# Patient Record
Sex: Female | Born: 1980 | Race: White | Hispanic: No | Marital: Married | Smoking: Never smoker
Health system: Southern US, Community
[De-identification: ages and names within clinical notes are randomized; demographics above are authoritative.]

## PROBLEM LIST (undated history)

## (undated) DIAGNOSIS — D649 Anemia, unspecified: Secondary | ICD-10-CM

## (undated) DIAGNOSIS — N83209 Unspecified ovarian cyst, unspecified side: Secondary | ICD-10-CM

## (undated) DIAGNOSIS — O139 Gestational [pregnancy-induced] hypertension without significant proteinuria, unspecified trimester: Secondary | ICD-10-CM

## (undated) HISTORY — DX: Unspecified ovarian cyst, unspecified side: N83.209

## (undated) HISTORY — DX: Gestational (pregnancy-induced) hypertension without significant proteinuria, unspecified trimester: O13.9

## (undated) HISTORY — PX: CERVICAL BIOPSY  W/ LOOP ELECTRODE EXCISION: SUR135

## (undated) HISTORY — DX: Hemochromatosis, unspecified: E83.119

## (undated) HISTORY — DX: Anemia, unspecified: D64.9

## (undated) HISTORY — PX: OTHER SURGICAL HISTORY: SHX169

---
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
## 2000-12-24 ENCOUNTER — Other Ambulatory Visit: Admission: RE | Admit: 2000-12-24 | Discharge: 2000-12-24 | Payer: Self-pay | Admitting: Obstetrics and Gynecology

## 2005-04-15 ENCOUNTER — Encounter (HOSPITAL_COMMUNITY): Admission: RE | Admit: 2005-04-15 | Discharge: 2005-05-02 | Payer: Self-pay | Admitting: Oncology

## 2005-04-15 ENCOUNTER — Ambulatory Visit (HOSPITAL_COMMUNITY): Payer: Self-pay | Admitting: Oncology

## 2005-04-15 ENCOUNTER — Encounter: Admission: RE | Admit: 2005-04-15 | Discharge: 2005-05-02 | Payer: Self-pay | Admitting: Oncology

## 2005-07-18 ENCOUNTER — Encounter: Admission: RE | Admit: 2005-07-18 | Discharge: 2005-07-18 | Payer: Self-pay | Admitting: Oncology

## 2005-07-18 ENCOUNTER — Ambulatory Visit (HOSPITAL_COMMUNITY): Payer: Self-pay | Admitting: Oncology

## 2005-11-06 ENCOUNTER — Ambulatory Visit (HOSPITAL_COMMUNITY): Payer: Self-pay | Admitting: Oncology

## 2005-11-06 ENCOUNTER — Encounter (HOSPITAL_COMMUNITY): Admission: RE | Admit: 2005-11-06 | Discharge: 2005-12-06 | Payer: Self-pay | Admitting: Oncology

## 2006-03-18 ENCOUNTER — Ambulatory Visit (HOSPITAL_COMMUNITY): Payer: Self-pay | Admitting: Oncology

## 2006-03-18 ENCOUNTER — Encounter (HOSPITAL_COMMUNITY): Admission: RE | Admit: 2006-03-18 | Discharge: 2006-04-17 | Payer: Self-pay | Admitting: Oncology

## 2006-10-16 ENCOUNTER — Ambulatory Visit (HOSPITAL_COMMUNITY): Payer: Self-pay | Admitting: Oncology

## 2007-04-02 ENCOUNTER — Inpatient Hospital Stay (HOSPITAL_COMMUNITY): Admission: AD | Admit: 2007-04-02 | Discharge: 2007-04-02 | Payer: Self-pay | Admitting: *Deleted

## 2007-04-03 ENCOUNTER — Inpatient Hospital Stay (HOSPITAL_COMMUNITY): Admission: AD | Admit: 2007-04-03 | Discharge: 2007-04-03 | Payer: Self-pay | Admitting: *Deleted

## 2007-04-06 ENCOUNTER — Encounter: Payer: Self-pay | Admitting: *Deleted

## 2007-04-06 ENCOUNTER — Encounter (INDEPENDENT_AMBULATORY_CARE_PROVIDER_SITE_OTHER): Payer: Self-pay | Admitting: Obstetrics and Gynecology

## 2007-04-06 ENCOUNTER — Inpatient Hospital Stay (HOSPITAL_COMMUNITY): Admission: AD | Admit: 2007-04-06 | Discharge: 2007-04-10 | Payer: Self-pay | Admitting: Obstetrics & Gynecology

## 2007-04-12 ENCOUNTER — Encounter: Admission: RE | Admit: 2007-04-12 | Discharge: 2007-05-10 | Payer: Self-pay | Admitting: *Deleted

## 2007-07-23 ENCOUNTER — Encounter (HOSPITAL_COMMUNITY): Admission: RE | Admit: 2007-07-23 | Discharge: 2007-08-04 | Payer: Self-pay | Admitting: Oncology

## 2007-07-23 ENCOUNTER — Ambulatory Visit (HOSPITAL_COMMUNITY): Payer: Self-pay | Admitting: Oncology

## 2008-09-23 ENCOUNTER — Encounter: Admission: RE | Admit: 2008-09-23 | Discharge: 2008-09-23 | Payer: Self-pay | Admitting: Family Medicine

## 2009-11-07 ENCOUNTER — Ambulatory Visit (HOSPITAL_COMMUNITY): Payer: Self-pay | Admitting: Oncology

## 2009-11-07 ENCOUNTER — Encounter (HOSPITAL_COMMUNITY): Admission: RE | Admit: 2009-11-07 | Discharge: 2009-12-07 | Payer: Self-pay | Admitting: Oncology

## 2010-08-26 ENCOUNTER — Encounter: Payer: Self-pay | Admitting: *Deleted

## 2010-10-23 LAB — CBC
MCHC: 34.9 g/dL (ref 30.0–36.0)
MCV: 90.8 fL (ref 78.0–100.0)
RDW: 12.8 % (ref 11.5–15.5)

## 2010-10-23 LAB — FERRITIN: Ferritin: 30 ng/mL (ref 10–291)

## 2010-11-04 ENCOUNTER — Encounter (HOSPITAL_COMMUNITY): Payer: 59

## 2010-11-06 ENCOUNTER — Ambulatory Visit (HOSPITAL_COMMUNITY): Payer: Self-pay | Admitting: Oncology

## 2010-11-06 ENCOUNTER — Other Ambulatory Visit (HOSPITAL_COMMUNITY): Payer: Self-pay

## 2010-12-17 NOTE — Op Note (Signed)
NAMEKAMERIN, AXFORD              ACCOUNT NO.:  [id number]   MEDICAL RECORD NO.:  [id number]          PATIENT TYPE:  OUT   LOCATION:  MFM                           FACILITY:  [facility]   PHYSICIAN:  Lenoard Aden, M.D.DATE OF BIRTH:  02/24/1981   DATE OF PROCEDURE:  04/06/2007  DATE OF DISCHARGE:                               OPERATIVE REPORT   PREOPERATIVE DIAGNOSIS:  Severe pre-eclampsia at 21 and 5/7 weeks.   POSTOPERATIVE DIAGNOSIS:  Severe pre-eclampsia at 33 and 5/7 weeks.   PROCEDURE:  Primary low segment transverse cesarean section.   SURGEON:  Lenoard Aden, M.D.   ANESTHESIA:  Spinal by Dr. Harvest Forest.   ESTIMATED BLOOD LOSS:  500 mL.   COMPLICATIONS:  None.   DRAINS:  Foley.   COUNTS:  Correct.   DISPOSITION:  The patient to recovery in good condition on magnesium  sulfate.   FINDINGS:  Full term living female, Apgars 8/8, pediatricians in  attendance, baby to NICU.   BRIEF OPERATIVE NOTE:  After being apprised of the risks of anesthesia,  infection, bleeding, intra-abdominal injury with need of repair, delayed  versus immediate to include bowel and bladder injury, the patient was  brought to the operating room where she was administered a spinal  anesthetic without complications.  She was prepped and draped in the  usual sterile fashion.  A Foley catheter was placed. After achieving  adequate anesthesia with dilute Marcaine solution, a Pfannenstiel skin  incision was made with the scalpel and carried down to the fascia which  is nicked in the midline and extended transversely using Mayo scissors.  The rectus muscles were dissected sharply in the midline, the peritoneum  entered sharply, bladder blade placed.  Visceral peritoneum scored  sharply off the lower uterine segment.  Kerr hysterotomy incision made.  Atraumatic delivery of a full term living female handed to the  pediatrician in attendance.  Apgars of 8/8, cord blood collected.  Placenta delivered  manually intact three vessel cord noted.  Uterus is  curetted using a dry lap pack and closed in two running imbricating  layers of 0 Monocryl suture.  Good hemostasis was noted. After achieving  adequate hemostasis, irrigation was accomplished.  An O'Leary stitch is  placed at the right lateral margin of the uterine incision. At this  time, good hemostasis is noted.  The bladder flap  was inspected and found to be hemostatic.  All blood clots are  subsequently removed.  The fascia then closed using 0 Monocryl in a  running fashion, the skin was closed using staples.  The patient  tolerated the procedure well and was transferred to the recovery room in  good condition.      Lenoard Aden, M.D.  Electronically Signed     RJT/MEDQ  D:  04/06/2007  T:  04/06/2007  Job:  [id number]

## 2010-12-17 NOTE — Discharge Summary (Signed)
NAMEPRINCESA, WILLIG              ACCOUNT NO.:  [id number]   MEDICAL RECORD NO.:  [id number]          PATIENT TYPE:  INP   LOCATION:                                 FACILITY:   PHYSICIAN:  Lenoard Aden, M.D.DATE OF BIRTH:  [birth year]   DATE OF ADMISSION:  04/06/2007  DATE OF DISCHARGE:  04/12/2007                               DISCHARGE SUMMARY   The patient underwent uncomplicated primary C-section for severe  preeclampsia at 33-5/7 weeks.  Postoperative course complicated by  persistent liver function abnormalities and decreased urine output,  placed on magnesium sulfate postpartum eventually tolerated a regular  diet well.  Blood pressure control stable.  Urine output then began to  improve postoperatively.  She was discharged to home as noted.   DISCHARGE MEDICATIONS:  1. Prenatal vitamins.  2. Iron.  3. Tylox.   FOLLOW-UP:  In the office scheduled within one week.  Discharge teaching  done.  Hypertensive and preeclamptic precautions given.      Lenoard Aden, M.D.  Electronically Signed     RJT/MEDQ  D:  05/04/2007  T:  05/04/2007  Job:  [id number]

## 2011-01-28 ENCOUNTER — Inpatient Hospital Stay (HOSPITAL_COMMUNITY)
Admission: RE | Admit: 2011-01-28 | Discharge: 2011-01-30 | DRG: 766 | Disposition: A | Discharge status: Home / Self Care | Payer: 59 | Source: Ambulatory Visit | Attending: Obstetrics and Gynecology | Admitting: Obstetrics and Gynecology

## 2011-01-28 DIAGNOSIS — O34219 Maternal care for unspecified type scar from previous cesarean delivery: Principal | ICD-10-CM | POA: Diagnosis present

## 2011-01-28 LAB — SURGICAL PCR SCREEN: Staphylococcus aureus: NEGATIVE

## 2011-01-28 LAB — CBC
MCHC: 34.9 g/dL (ref 30.0–36.0)
Platelets: 191 10*3/uL (ref 150–400)
RDW: 12.7 % (ref 11.5–15.5)
WBC: 8.6 10*3/uL (ref 4.0–10.5)

## 2011-01-28 LAB — RPR: RPR Ser Ql: NONREACTIVE

## 2011-01-29 LAB — CBC
HCT: 31.2 % — ABNORMAL LOW (ref 36.0–46.0)
Hemoglobin: 10.8 g/dL — ABNORMAL LOW (ref 12.0–15.0)
MCH: 32.6 pg (ref 26.0–34.0)
MCHC: 34.6 g/dL (ref 30.0–36.0)
RDW: 12.8 % (ref 11.5–15.5)

## 2011-02-02 ENCOUNTER — Inpatient Hospital Stay (HOSPITAL_COMMUNITY): Admission: AD | Admit: 2011-02-02 | Payer: Self-pay | Source: Ambulatory Visit | Admitting: Obstetrics and Gynecology

## 2011-02-07 NOTE — H&P (Signed)
  NAMEJADALYN, Elizabeth Carr              ACCOUNT NO.:  [id number]  MEDICAL RECORD NO.:  [id number]  LOCATION:  103                            FACILITY:  PHYSICIAN:  Lenoard Aden, M.D.     DATE OF BIRTH:  DATE OF ADMISSION: DATE OF DISCHARGE:                             HISTORY & PHYSICAL   CHIEF COMPLAINT:  Elective repeat C-section.  HISTORY OF PRESENT ILLNESS:  She is a 30 year old white female G2, P1, currently at 37 weeks' gestation, who presents for repeat low-segment transverse cesarean section.  Previous C-section performed for severe preeclampsia at 34 weeks.  Medications include prenatal vitamins.  She has no known drug allergies.  FAMILY HISTORY:  Heart disease, chronic hypertension, pancreatic cancer, and thyroid disease.  She is a nonsmoker, nondrinker.  She denies domestic or physical violence.  Prenatal course to date has been uncomplicated.  PHYSICAL EXAMINATION:  GENERAL:  She is a well-developed, well-nourished white female in no acute distress. HEENT:  Normal. NECK:  Supple.  Full range of motion. LUNGS:  Clear. HEART:  Regular rhythm. ABDOMEN:  Soft, gravid, nontender.  Estimated fetal weight 7.5 pounds. Cervix is closed, 50%, vertex -1. EXTREMITIES:  No cords. NEUROLOGIC:  Nonfocal. SKIN:  Intact.  IMPRESSION:  Term intrauterine pregnancy at 39 weeks with previous cesarean section for elective repeat.  PLAN:  Proceed with a repeat low-segment transverse cesarean section. Risks of anesthesia, infection, bleeding, injury to abdominal organs, and need for repair were discussed.  Delayed versus immediate complications to include bowel and bladder injury noted.  The patient acknowledges and wishes to proceed.     Lenoard Aden, M.D.     RJT/MEDQ  D:  01/28/2011  T:  01/28/2011  Job:  [id number]  Electronically Signed by Olivia Mackie M.D. on 02/07/2011 07:36:37 AM

## 2011-02-07 NOTE — Op Note (Signed)
  [name], Elizabeth Carr              ACCOUNT NO.:  [id number]  MEDICAL RECORD NO.:  [id number]  LOCATION:  9103                          FACILITY:  [facility]  PHYSICIAN:  Lenoard Aden, M.D.DATE OF BIRTH:  Dec 14, 1980  DATE OF PROCEDURE: DATE OF DISCHARGE:                              OPERATIVE REPORT   PREOPERATIVE DIAGNOSES:  A 39-week intrauterine pregnancy with previous cesarean section for elective repeat.  POSTOPERATIVE DIAGNOSES:  A 39-week intrauterine pregnancy with previous cesarean section for elective repeat plus lower uterine segment window.  PROCEDURE:  Repeat low segment transverse cesarean section.  SURGEON:  Lenoard Aden, MD  ASSISTANT:  Marlinda Mike, CNM  ANESTHESIA:  Spinal by Quillian Quince, MD  ESTIMATED BLOOD LOSS:  800 mL.  COMPLICATIONS:  None.  DRAINS:  Foley.  COUNTS:  Correct.  DISPOSITION:  The patient to recovery in good condition.  BRIEF OPERATIVE NOTE:  After being apprised of the risks of anesthesia, infection, bleeding, injury to abdominal organs, need for repair, delayed versus immediate complications to include bowel and bladder injury, possible need for repair, the patient was brought to the operating room where she was administered spinal anesthetic without complications, prepped and draped in usual sterile fashion.  Foley catheter was placed after achieving adequate anesthesia, dilute Marcaine solution placed.  A Pfannenstiel skin incision was made with a scalpel, carried down to fascia which was nicked in the midline and opened transversely using Mayo scissors.  Rectus muscles were dissected sharply in the midline and peritoneum entered sharply.  Bladder blade was placed.  Visceral peritoneum scored sharply off thinned lower uterine segment with about a 2-3 cm thin transparent window noted.  At this time, Sharl Ma hysterotomy incision was made.  Amniotomy clear fluid. Atraumatic delivery.  Full term living female.  Occiput  anterior position.  Apgars 8 and 9.  Cord blood collected.  Placenta delivered manually intact.  Uterus curetted using a dry lap pack.  Normal tubes and normal ovaries noted.  Uterus closed in two running imbricating layers of 0 Monocryl suture with a single interrupted suture placed in the midline for hemostasis.  Bladder flap inspected, found to be hemostatic.  Irrigation accomplished.  Urine output was copious and urine is clear.  At this time, the fascia was reapproximated using 0 Monocryl in a continuous running fashion.  Skin closed using staples.  The patient tolerated the procedure well, transferred to recovery in good condition.     Lenoard Aden, M.D.     RJT/MEDQ  D:  01/28/2011  T:  01/29/2011  Job:  [id number]  Electronically Signed by Olivia Mackie M.D. on 02/07/2011 07:36:41 AM

## 2011-03-14 NOTE — Discharge Summary (Signed)
  Elizabeth Carr, Elizabeth Carr              ACCOUNT NO.:  [id number]  MEDICAL RECORD NO.:  [id number]  LOCATION:  9103                          FACILITY:  [facility]  PHYSICIAN:  Lenoard Aden, M.D.DATE OF BIRTH:  11/01/1980  DATE OF ADMISSION:  01/28/2011 DATE OF DISCHARGE:  01/30/2011                              DISCHARGE SUMMARY   ADMITTING DIAGNOSIS:  Previous cesarean section at 39 weeks, desires repeat.  DISCHARGE DIAGNOSIS:  Postoperative day #2 status post cesarean section repeat.  HISTORY:  The patient is a 30 year old gravida 2, para 1 at 36 weeks' gestation with an EDC of February 02, 2011.  Prenatal care at [facility] since 7 weeks' gestation with Dr. Billy Coast as primary.  Prenatal labs include type and Rh O+, antibody screen negative, rubella positive, HIV negative, hepatitis B negative, RPR negative, GBS screen negative.  Prenatal course was complicated with a history of severe preeclampsia and HELLP syndrome at 21 weeks' gestation with repeat cesarean section planned this pregnancy, history of transfusion in 2008, status post cesarean section.  ALLERGIES:  No known drug allergies.  MEDICATIONS:  Prenatal vitamin 1 tablet p.o. daily.  ADMISSION REASON:  Scheduled cesarean section.  Admission CBC; white blood cell count 8.6, hemoglobin 11.7, hematocrit 33.5, and a platelet count of 191.  PROCEDURE:  The patient underwent cesarean section on January 28, 2011, by Dr. Billy Coast, delivery of a female 7 pounds 6 ounces, newborn to regular nursery.  POSTOPERATIVE COURSE:  The patient's postoperative course was uneventful.  The patient had no evidence of preeclampsia or HELLP syndrome this pregnancy.  Postoperative CBC; white blood cell count 10.2, hemoglobin 10.8, hematocrit 31.2, and a platelet count of 168. Vital signs were stable.  The patient remained afebrile during hospitalization.  Physical exam was within normal limits.  Wound edges were well approximated with staples.  No erythema,  no ecchymosis or drainage.  The patient is discharged home in stable condition on postoperative day 2.  DISCHARGE INSTRUCTIONS:  Postpartum instructions per [facility] booklet.  ACTIVITY:  Restrictions x2 weeks per postoperative delivery.  DIET:  Regular.  MEDICATIONS:  Prenatal vitamin 1 tablet p.o. daily, ibuprofen 800 mg every 8 hours as needed for discomfort, and Percocet 1 tablet every 4 hours as needed for pain.  FOLLOWUP INSTRUCTIONS:  The patient to follow up at [facility] on postoperative day 5 for staple removal and in 6 weeks for routine postpartum care.     Marlinda Mike, C.N.M.   ______________________________ Lenoard Aden, M.D.    TB/MEDQ  D:  02/03/2011  T:  02/04/2011  Job:  [id number]  Electronically Signed by Marlinda Mike C.N.M. on 02/19/2011 07:02:20 PM Electronically Signed by Olivia Mackie M.D. on 03/14/2011 01:13:17 PM

## 2011-05-09 LAB — FERRITIN: Ferritin: 46 (ref 10–291)

## 2011-05-09 LAB — CBC
MCHC: 34
MCV: 89.4
Platelets: 196
RBC: 4.55
WBC: 3.7 — ABNORMAL LOW

## 2011-05-09 LAB — DIFFERENTIAL
Basophils Relative: 1
Eosinophils Absolute: 0.1 — ABNORMAL LOW
Monocytes Relative: 10
Neutro Abs: 1.1 — ABNORMAL LOW
Neutrophils Relative %: 29 — ABNORMAL LOW

## 2011-05-16 LAB — CBC
HCT: 21.4 — ABNORMAL LOW
HCT: 22.5 — ABNORMAL LOW
HCT: 25.5 — ABNORMAL LOW
HCT: 30.1 — ABNORMAL LOW
HCT: 34.7 — ABNORMAL LOW
Hemoglobin: 12.1
Hemoglobin: 7.3 — CL
Hemoglobin: 7.8 — CL
Hemoglobin: 8.1 — ABNORMAL LOW
Hemoglobin: 8.9 — ABNORMAL LOW
Hemoglobin: 9.3 — ABNORMAL LOW
MCHC: 34.3
MCHC: 34.8
MCHC: 35.3
MCV: 100.2 — ABNORMAL HIGH
MCV: 94.8
MCV: 98.4
MCV: 98.5
MCV: 98.8
Platelets: 172
Platelets: 178
Platelets: 192
RBC: 2.13 — ABNORMAL LOW
RBC: 2.32 — ABNORMAL LOW
RBC: 2.37 — ABNORMAL LOW
RBC: 2.79 — ABNORMAL LOW
RBC: 3.88
RDW: 14.1 — ABNORMAL HIGH
RDW: 14.2 — ABNORMAL HIGH
RDW: 14.3 — ABNORMAL HIGH
RDW: 14.5 — ABNORMAL HIGH
WBC: 14.5 — ABNORMAL HIGH
WBC: 16.3 — ABNORMAL HIGH
WBC: 20 — ABNORMAL HIGH
WBC: 20.9 — ABNORMAL HIGH

## 2011-05-16 LAB — COMPREHENSIVE METABOLIC PANEL
ALT: 312 — ABNORMAL HIGH
ALT: 337 — ABNORMAL HIGH
ALT: 453 — ABNORMAL HIGH
ALT: 460 — ABNORMAL HIGH
AST: 211 — ABNORMAL HIGH
AST: 355 — ABNORMAL HIGH
AST: 372 — ABNORMAL HIGH
AST: 77 — ABNORMAL HIGH
Albumin: 2.2 — ABNORMAL LOW
Albumin: 2.8 — ABNORMAL LOW
Alkaline Phosphatase: 112
Alkaline Phosphatase: 127 — ABNORMAL HIGH
Alkaline Phosphatase: 81
Alkaline Phosphatase: 94
BUN: 10
BUN: 11
BUN: 13
BUN: 19
BUN: 7
CO2: 26
CO2: 27
CO2: 29
CO2: 30
Calcium: 6.6 — ABNORMAL LOW
Calcium: 6.8 — ABNORMAL LOW
Calcium: 7 — ABNORMAL LOW
Chloride: 104
Chloride: 107
Chloride: 107
Creatinine, Ser: 0.71
Creatinine, Ser: 0.76
Creatinine, Ser: 0.8
Creatinine, Ser: 0.92
Creatinine, Ser: 0.96
GFR calc Af Amer: >60
GFR calc Af Amer: >60
GFR calc non Af Amer: >60
GFR calc non Af Amer: >60
GFR calc non Af Amer: >60
GFR calc non Af Amer: >60
Glucose, Bld: 102 — ABNORMAL HIGH
Glucose, Bld: 104 — ABNORMAL HIGH
Glucose, Bld: 111 — ABNORMAL HIGH
Glucose, Bld: 84
Glucose, Bld: 94
Potassium: 3.6
Potassium: 3.7
Potassium: 4
Potassium: 4.4
Sodium: 131 — ABNORMAL LOW
Sodium: 135
Sodium: 136
Sodium: 138
Total Bilirubin: 0.4
Total Bilirubin: 0.4
Total Bilirubin: 0.6
Total Bilirubin: 0.6
Total Protein: 4.1 — ABNORMAL LOW
Total Protein: 4.2 — ABNORMAL LOW
Total Protein: 4.3 — ABNORMAL LOW
Total Protein: 5.1 — ABNORMAL LOW

## 2011-05-16 LAB — MAGNESIUM
Magnesium: 5 — ABNORMAL HIGH
Magnesium: 5.1 — ABNORMAL HIGH
Magnesium: 5.3 — ABNORMAL HIGH
Magnesium: 5.6 — ABNORMAL HIGH

## 2011-05-16 LAB — URIC ACID
Uric Acid, Serum: 6.3
Uric Acid, Serum: 6.4
Uric Acid, Serum: 6.6
Uric Acid, Serum: 7.6 — ABNORMAL HIGH

## 2011-05-16 LAB — CROSSMATCH: Antibody Screen: NEGATIVE

## 2011-05-16 LAB — DIFFERENTIAL
Basophils Relative: 0
Eosinophils Absolute: 0
Eosinophils Absolute: 0
Eosinophils Relative: 0
Lymphs Abs: 4.4 — ABNORMAL HIGH
Lymphs Abs: 5.6 — ABNORMAL HIGH
Monocytes Absolute: 1.1 — ABNORMAL HIGH
Monocytes Relative: 5
Monocytes Relative: 5
Neutro Abs: 17.2 — ABNORMAL HIGH
Neutrophils Relative %: 72
Neutrophils Relative %: 75

## 2011-05-16 LAB — LACTATE DEHYDROGENASE
LDH: 253 — ABNORMAL HIGH
LDH: 284 — ABNORMAL HIGH
LDH: 307 — ABNORMAL HIGH
LDH: 501 — ABNORMAL HIGH

## 2011-05-16 LAB — URINALYSIS, DIPSTICK ONLY
Ketones, ur: NEGATIVE
Leukocytes, UA: NEGATIVE
Nitrite: NEGATIVE
Specific Gravity, Urine: >1.03 — ABNORMAL HIGH
Urobilinogen, UA: 0.2
pH: 5.5

## 2011-05-16 LAB — URINALYSIS, ROUTINE W REFLEX MICROSCOPIC
Bilirubin Urine: NEGATIVE
Glucose, UA: NEGATIVE
Ketones, ur: NEGATIVE
Leukocytes, UA: NEGATIVE
pH: 6

## 2011-05-16 LAB — FETAL FIBRONECTIN: Fetal Fibronectin: NEGATIVE

## 2011-05-16 LAB — URINE MICROSCOPIC-ADD ON

## 2011-08-11 ENCOUNTER — Ambulatory Visit
Admission: RE | Admit: 2011-08-11 | Discharge: 2011-08-11 | Disposition: A | Discharge status: Home / Self Care | Payer: 59 | Source: Ambulatory Visit | Attending: Pediatrics | Admitting: Pediatrics

## 2011-08-11 ENCOUNTER — Other Ambulatory Visit: Payer: Self-pay | Admitting: Pediatrics

## 2011-08-11 DIAGNOSIS — R05 Cough: Secondary | ICD-10-CM

## 2011-08-11 DIAGNOSIS — R0789 Other chest pain: Secondary | ICD-10-CM

## 2011-12-26 ENCOUNTER — Ambulatory Visit (HOSPITAL_COMMUNITY): Payer: 59 | Admitting: Oncology

## 2011-12-30 ENCOUNTER — Encounter (HOSPITAL_COMMUNITY): Payer: Self-pay | Admitting: Oncology

## 2011-12-30 ENCOUNTER — Encounter (HOSPITAL_COMMUNITY): Payer: 59 | Attending: Oncology | Admitting: Oncology

## 2011-12-30 DIAGNOSIS — D649 Anemia, unspecified: Secondary | ICD-10-CM

## 2011-12-30 DIAGNOSIS — D509 Iron deficiency anemia, unspecified: Secondary | ICD-10-CM

## 2011-12-30 LAB — CBC
HCT: 38.3 % (ref 36.0–46.0)
MCHC: 34.5 g/dL (ref 30.0–36.0)
RDW: 13 % (ref 11.5–15.5)

## 2011-12-30 NOTE — Patient Instructions (Signed)
Elizabeth Carr  [id number] 1980/11/17 Dr. Glenford Peers   [facility]  Discharge Instructions  RECOMMENDATIONS MADE BY THE CONSULTANT AND ANY TEST RESULTS WILL BE SENT TO YOUR REFERRING DOCTOR.   EXAM FINDINGS BY MD TODAY AND SIGNS AND SYMPTOMS TO REPORT TO CLINIC OR PRIMARY MD: Exam and discussion per MD.  We will check labs today and in 18 months.  If labs are abnormal we will call you.  MEDICATIONS PRESCRIBED: none      SPECIAL INSTRUCTIONS/FOLLOW-UP: Lab work Needed today and in 18 months and Return to Clinic to see MD after labs in 18 months.   I acknowledge that I have been informed and understand all the instructions given to me and received a copy. I do not have any more questions at this time, but understand that I may call the Specialty Clinic at [facility] at [phone] during business hours should I have any further questions or need assistance in obtaining follow-up care.    __________________________________________  _____________  __________ Signature of Patient or Authorized Representative            Date                   Time    __________________________________________ Nurse's Signature

## 2011-12-30 NOTE — Progress Notes (Signed)
Elizabeth Carr presented for labwork. Labs per MD order drawn via Peripheral Line 23 gauge needle inserted in right AC  Good blood return present. Procedure without incident.  Needle removed intact. Patient tolerated procedure well.

## 2011-12-30 NOTE — Progress Notes (Signed)
This office note has been dictated.

## 2011-12-31 NOTE — Progress Notes (Signed)
CC:   Lazaro Arms, M.D.  DIAGNOSES: 1. 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 2. Iron deficiency however, in the past with mild anemia. 3. HELLP syndrome with the birth of her son, 2009.  He is now 31 years     old and she has one other child, a daughter, 52 months old. 4. Excessive tanning use in the past. 5. History wisdom tooth extraction years ago without complication.  HISTORY OF PRESENT ILLNESS:  Elizabeth Carr had her 2nd child last year so was not able to come for her lab work.  She is still menstruating of course, should be more protected than a lot of people with hemochromatosis until after her menses and pregnancies are over.  I do not know if she plans to have any other children but she needs a CBC and ferritin today.  REVIEW OF SYSTEMS:  She is working full time.  Feels great.  No complaints.  So we will see her back in 18 months.  This time with a CBC and ferritin.  She will let us know sooner if there is an issue, but we will be in touch with her after we get her lab work back today.    ______________________________ Ladona Horns. Elizabeth Sleet, MD ESN/MEDQ  D:  12/30/2011  T:  12/31/2011  Job:  [id number]

## 2012-02-24 ENCOUNTER — Telehealth (HOSPITAL_COMMUNITY): Payer: Self-pay

## 2013-05-23 ENCOUNTER — Other Ambulatory Visit: Payer: Self-pay | Admitting: Physician Assistant

## 2013-05-23 ENCOUNTER — Ambulatory Visit
Admission: RE | Admit: 2013-05-23 | Discharge: 2013-05-23 | Disposition: A | Discharge status: Home / Self Care | Payer: 59 | Source: Ambulatory Visit | Attending: Physician Assistant | Admitting: Physician Assistant

## 2013-05-23 DIAGNOSIS — R1031 Right lower quadrant pain: Secondary | ICD-10-CM

## 2013-06-06 ENCOUNTER — Encounter (HOSPITAL_COMMUNITY): Payer: Self-pay

## 2013-06-06 ENCOUNTER — Encounter (HOSPITAL_COMMUNITY): Payer: 59 | Attending: Hematology and Oncology

## 2013-06-06 LAB — CBC
MCV: 88.9 fL (ref 78.0–100.0)
Platelets: 264 10*3/uL (ref 150–400)
RDW: 12.8 % (ref 11.5–15.5)
WBC: 4.4 10*3/uL (ref 4.0–10.5)

## 2013-06-06 NOTE — Patient Instructions (Signed)
[facility] Discharge Instructions  RECOMMENDATIONS MADE BY THE CONSULTANT AND ANY TEST RESULTS WILL BE SENT TO YOUR REFERRING PHYSICIAN.  Lab work today, CBC and Ferritin. We will call you if there are any abnormal/unexpected results. Return to clinic in 1 year to see MD with same lab work.  Thank you for choosing [facility] to provide your oncology and hematology care.  To afford each patient quality time with our providers, please arrive at least 15 minutes before your scheduled appointment time.  With your help, our goal is to use those 15 minutes to complete the necessary work-up to ensure our physicians have the information they need to help with your evaluation and healthcare recommendations.    Effective January 1st, 2014, we ask that you re-schedule your appointment with our physicians should you arrive 10 or more minutes late for your appointment.  We strive to give you quality time with our providers, and arriving late affects you and other patients whose appointments are after yours.    Again, thank you for choosing [facility].  Our hope is that these requests will decrease the amount of time that you wait before being seen by our physicians.       _____________________________________________________________  Should you have questions after your visit to [facility], please contact our office at [phone] between the hours of 8:30 a.m. and 5:00 p.m.  Voicemails left after 4:30 p.m. will not be returned until the following business day.  For prescription refill requests, have your pharmacy contact our office with your prescription refill request.

## 2013-06-06 NOTE — Progress Notes (Signed)
[facility]  OFFICE PROGRESS NOTE  Maryelizabeth Rowan, MD [address] [place] [address]  DIAGNOSIS: Hemochromatosis - Plan: CBC, Ferritin  Chief Complaint  Patient presents with  . hemochromatosis    CURRENT THERAPY: No active therapy since the patient menstruates monthly. At  INTERVAL HISTORY: Elizabeth Carr 32 y.o. female returns for followup of hemochromatosis on no active phlebotomy therapy because of premenopausal state. Her last pressure. Was October 25 lasting 4 days which is average. She denies any pruritus, dark urine, cough, shortness of breath, PND, orthopnea, palpitations, lower extremity swelling or redness, skin rash, joint pain, headache, or seizures.   MEDICAL HISTORY: Past Medical History  Diagnosis Date  . Anemia   . Hemochromatosis   . Pregnancy induced hypertension     with first pregnancy  . Ovarian cyst     INTERIM HISTORY:  does not have a problem list on file.    ALLERGIES:  has No Known Allergies.  MEDICATIONS: has a current medication list which includes the following prescription(s): norgestimate-ethinyl estradiol.  SURGICAL HISTORY:  Past Surgical History  Procedure Laterality Date  . [place] teeth extraction    . Cesarean section      x 2  . Cervical biopsy  w/ loop electrode excision      2010    FAMILY HISTORY: family history is not on file.  SOCIAL HISTORY:  reports that she has never smoked. She does not have any smokeless tobacco history on file. She reports that she does not drink alcohol or use illicit drugs.  REVIEW OF SYSTEMS:  Other than that discussed above is noncontributory.  PHYSICAL EXAMINATION: ECOG PERFORMANCE STATUS: 0 - Asymptomatic  Blood pressure 124/85, pulse 60, temperature 97.6 F (36.4 C), temperature source Oral, resp. rate 16, weight 124 lb 9.6 oz (56.518 kg).  GENERAL:alert, no distress and comfortable SKIN: skin color, texture, turgor are  normal, no rashes or significant lesions EYES: PERLA; Conjunctiva are pink and non-injected, sclera clear OROPHARYNX:no exudate, no erythema on lips, buccal mucosa, or tongue. NECK: supple, thyroid normal size, non-tender, without nodularity. No masses CHEST: Normal AP diameter with no breast masses. LYMPH:  no palpable lymphadenopathy in the cervical, axillary or inguinal LUNGS: clear to auscultation and percussion with normal breathing effort HEART: regular rate & rhythm and no murmurs. ABDOMEN:abdomen soft, non-tender and normal bowel sounds MUSCULOSKELETAL:no cyanosis of digits and no clubbing. Range of motion normal.  NEURO: alert & oriented x 3 with fluent speech, no focal motor/sensory deficits   LABORATORY DATA: Office Visit on 06/06/2013  Component Date Value Range Status  . WBC 06/06/2013 4.4  4.0 - 10.5 K/uL Final  . RBC 06/06/2013 4.60  3.87 - 5.11 MIL/uL Final  . Hemoglobin 06/06/2013 14.0  12.0 - 15.0 g/dL Final  . HCT 60/45/4098 40.9  36.0 - 46.0 % Final  . MCV 06/06/2013 88.9  78.0 - 100.0 fL Final  . MCH 06/06/2013 30.4  26.0 - 34.0 pg Final  . MCHC 06/06/2013 34.2  30.0 - 36.0 g/dL Final  . RDW 11/91/4782 12.8  11.5 - 15.5 % Final  . Platelets 06/06/2013 264  150 - 400 K/uL Final    PATHOLOGY: None new.  Urinalysis    Component Value Date/Time   COLORURINE YELLOW 04/06/2007 2330   APPEARANCEUR CLEAR 04/06/2007 2330   LABSPEC >1.030* 04/07/2007 1016   PHURINE 5.5 04/07/2007 1016   GLUCOSEU NEGATIVE 04/07/2007 1016   HGBUR  SMALL* 04/07/2007 1016   BILIRUBINUR NEGATIVE 04/07/2007 1016   KETONESUR NEGATIVE 04/07/2007 1016   PROTEINUR NEGATIVE 04/07/2007 1016   UROBILINOGEN 0.2 04/07/2007 1016   NITRITE NEGATIVE 04/07/2007 1016   LEUKOCYTESUR NEGATIVE 04/07/2007 1016    RADIOGRAPHIC STUDIES: US Transvaginal Non-ob  Jun 13, 2013   CLINICAL DATA:  Right lower quadrant pain  EXAM: TRANSABDOMINAL ULTRASOUND OF PELVIS  TECHNIQUE: Transabdominal ultrasound examination of the pelvis was  performed including evaluation of the uterus, ovaries, adnexal regions, and pelvic cul-de-sac.  COMPARISON:  None.  FINDINGS: Uterus  Measurements: Measures 7.6 x 4.0 x 4.3 cm . Small hypoechoic structure is identified within the posterior myometrium measuring 0.4 cm.  Endometrium  Thickness: 11.4 mm. . No focal abnormality visualized.  Right ovary  Measurements: Measures 3.1 x 2.8 x 1.8 cm. . Normal appearance/no adnexal mass.  Left ovary  Measurements: 4.9 x 3.8 x 4.7 cm. Cyst within the left ovary measures 4 x 3.3 x 3.6 cm. This appears anechoic with increased through transmission. No complicating features noted.  Other findings: Small amount of free fluid is noted within the cul-de-sac  IMPRESSION: 1. Small amount of free fluid is identified within the dependent portion of the pelvis.  2. Left ovarian cyst  3. Small fibroid within the posterior myometrium.   Electronically Signed   By: Signa Kell M.D.   On: 06/13/13 16:31   US Pelvis Complete  05/23/2013   CLINICAL DATA:  Right lower quadrant pain  EXAM: TRANSABDOMINAL ULTRASOUND OF PELVIS  TECHNIQUE: Transabdominal ultrasound examination of the pelvis was performed including evaluation of the uterus, ovaries, adnexal regions, and pelvic cul-de-sac.  COMPARISON:  None.  FINDINGS: Uterus  Measurements: Measures 7.6 x 4.0 x 4.3 cm. Small hypoechoic structure is identified within the posterior myometrium measuring 0.4 cm.  Endometrium  Thickness: 11.4 mm.  No focal abnormality visualized.  Right ovary  Measurements: Measures 3.1 x 2.8 x 1.8 cm. Normal appearance/no adnexal mass.  Left ovary  Measurements: 4.9 x 3.8 x 4.7 cm. Cyst within the left ovary measures 4 x 3.3 x 3.6 cm. This appears anechoic with increased through transmission. No complicating features noted.  Other findings: Small amount of free fluid is noted within the cul-de-sac  IMPRESSION: 1. Small amount of free fluid is identified within the dependent portion of the pelvis.  2. Left  ovarian cyst  3. Small fibroid within the posterior myometrium.   Electronically Signed   By: Signa Kell M.D.   On: 05/23/2013 15:44    ASSESSMENT:  #1. Hemochromatosis, currently premenopausal and not requiring any phlebotomy. Patient does adhere to a diet low in red meat and avoids ingestion of high iron-containing foods such as raisins apricots.   PLAN:  #1. CBC and ferritin today. #2. Told to call should any new symptoms occur that are troublesome and persistent. #3. Followup in one year.   All questions were answered. The patient knows to call the clinic with any problems, questions or concerns. We can certainly see the patient much sooner if necessary.   I spent 25 minutes counseling the patient face to face. The total time spent in the appointment was 30 minutes.    Maurilio Lovely, MD 06/06/2013 10:13 AM

## 2014-03-15 ENCOUNTER — Ambulatory Visit (INDEPENDENT_AMBULATORY_CARE_PROVIDER_SITE_OTHER): Payer: 59 | Admitting: Physician Assistant

## 2014-03-15 ENCOUNTER — Encounter: Payer: Self-pay | Admitting: Physician Assistant

## 2014-03-15 VITALS — BP 108/80 | HR 72 | Temp 98.4°F | Resp 18 | Ht 61.75 in | Wt 129.0 lb

## 2014-03-15 DIAGNOSIS — Z0000 Encounter for general adult medical examination without abnormal findings: Secondary | ICD-10-CM

## 2014-03-15 LAB — CBC WITH DIFFERENTIAL/PLATELET
BASOS PCT: 0.5 % (ref 0.0–3.0)
Basophils Absolute: 0 10*3/uL (ref 0.0–0.1)
EOS PCT: 2 % (ref 0.0–5.0)
Eosinophils Absolute: 0.1 10*3/uL (ref 0.0–0.7)
HEMATOCRIT: 37.9 % (ref 36.0–46.0)
HEMOGLOBIN: 12.9 g/dL (ref 12.0–15.0)
LYMPHS ABS: 2 10*3/uL (ref 0.7–4.0)
Lymphocytes Relative: 41.4 % (ref 12.0–46.0)
MCHC: 33.9 g/dL (ref 30.0–36.0)
MCV: 91.3 fl (ref 78.0–100.0)
MONO ABS: 0.4 10*3/uL (ref 0.1–1.0)
MONOS PCT: 8.3 % (ref 3.0–12.0)
NEUTROS ABS: 2.3 10*3/uL (ref 1.4–7.7)
Neutrophils Relative %: 47.8 % (ref 43.0–77.0)
PLATELETS: 206 10*3/uL (ref 150.0–400.0)
RBC: 4.15 Mil/uL (ref 3.87–5.11)
RDW: 13.5 % (ref 11.5–15.5)
WBC: 4.9 10*3/uL (ref 4.0–10.5)

## 2014-03-15 LAB — LIPID PANEL
CHOLESTEROL: 175 mg/dL (ref 0–200)
HDL: 90 mg/dL (ref >39.00)
LDL CALC: 69 mg/dL (ref 0–99)
NonHDL: 85
TRIGLYCERIDES: 80 mg/dL (ref 0.0–149.0)
Total CHOL/HDL Ratio: 2
VLDL: 16 mg/dL (ref 0.0–40.0)

## 2014-03-15 LAB — COMPREHENSIVE METABOLIC PANEL
ALK PHOS: 43 U/L (ref 39–117)
ALT: 14 U/L (ref 0–35)
AST: 16 U/L (ref 0–37)
Albumin: 3.7 g/dL (ref 3.5–5.2)
BILIRUBIN TOTAL: 0.6 mg/dL (ref 0.2–1.2)
BUN: 10 mg/dL (ref 6–23)
CO2: 27 meq/L (ref 19–32)
CREATININE: 0.7 mg/dL (ref 0.4–1.2)
Calcium: 9 mg/dL (ref 8.4–10.5)
Chloride: 107 mEq/L (ref 96–112)
GFR: 109.8 mL/min (ref >60.00)
GLUCOSE: 79 mg/dL (ref 70–99)
Potassium: 3.6 mEq/L (ref 3.5–5.1)
Sodium: 139 mEq/L (ref 135–145)
TOTAL PROTEIN: 6.9 g/dL (ref 6.0–8.3)

## 2014-03-15 LAB — POCT URINALYSIS DIPSTICK
BILIRUBIN UA: NEGATIVE
Glucose, UA: NEGATIVE
KETONES UA: NEGATIVE
LEUKOCYTES UA: NEGATIVE
Nitrite, UA: NEGATIVE
PH UA: 5.5
Protein, UA: NEGATIVE
SPEC GRAV UA: 1.02
Urobilinogen, UA: 0.2

## 2014-03-15 NOTE — Patient Instructions (Addendum)
Try to keep a healthy, varied diet and regular exercise to maintain your get health.  If emergency symptoms discussed during visit developed, seek medical attention immediately.  Followup as needed for all other concerns, and in approximately one year for an annual physical.    Health Maintenance Adopting a healthy lifestyle and getting preventive care can go a long way to promote health and wellness. Talk with your health care provider about what schedule of regular examinations is right for you. This is a good chance for you to check in with your provider about disease prevention and staying healthy. In between checkups, there are plenty of things you can do on your own. Experts have done a lot of research about which lifestyle changes and preventive measures are most likely to keep you healthy. Ask your health care provider for more information. WEIGHT AND DIET  Eat a healthy diet  Be sure to include plenty of vegetables, fruits, low-fat dairy products, and lean protein.  Do not eat a lot of foods high in solid fats, added sugars, or salt.  Get regular exercise. This is one of the most important things you can do for your health.  Most adults should exercise for at least 150 minutes each week. The exercise should increase your heart rate and make you sweat (moderate-intensity exercise).  Most adults should also do strengthening exercises at least twice a week. This is in addition to the moderate-intensity exercise.  Maintain a healthy weight  Body mass index (BMI) is a measurement that can be used to identify possible weight problems. It estimates body fat based on height and weight. Your health care provider can help determine your BMI and help you achieve or maintain a healthy weight.  For females 63 years of age and older:   A BMI below 18.5 is considered underweight.  A BMI of 18.5 to 24.9 is normal.  A BMI of 25 to 29.9 is considered overweight.  A BMI of 30 and above is  considered obese.  Watch levels of cholesterol and blood lipids  You should start having your blood tested for lipids and cholesterol at 33 years of age, then have this test every 5 years.  You may need to have your cholesterol levels checked more often if:  Your lipid or cholesterol levels are high.  You are older than 33 years of age.  You are at high risk for heart disease.  CANCER SCREENING   Lung Cancer  Lung cancer screening is recommended for adults 38-21 years old who are at high risk for lung cancer because of a history of smoking.  A yearly low-dose CT scan of the lungs is recommended for people who:  Currently smoke.  Have quit within the past 15 years.  Have at least a 30-pack-year history of smoking. A pack year is smoking an average of one pack of cigarettes a day for 1 year.  Yearly screening should continue until it has been 15 years since you quit.  Yearly screening should stop if you develop a health problem that would prevent you from having lung cancer treatment.  Breast Cancer  Practice breast self-awareness. This means understanding how your breasts normally appear and feel.  It also means doing regular breast self-exams. Let your health care provider know about any changes, no matter how small.  If you are in your 20s or 30s, you should have a clinical breast exam (CBE) by a health care provider every 1-3 years as part of a  regular health exam.  If you are 40 or older, have a CBE every year. Also consider having a breast X-ray (mammogram) every year.  If you have a family history of breast cancer, talk to your health care provider about genetic screening.  If you are at high risk for breast cancer, talk to your health care provider about having an MRI and a mammogram every year.  Breast cancer gene (BRCA) assessment is recommended for women who have family members with BRCA-related cancers. BRCA-related cancers  include:  Breast.  Ovarian.  Tubal.  Peritoneal cancers.  Results of the assessment will determine the need for genetic counseling and BRCA1 and BRCA2 testing. Cervical Cancer Routine pelvic examinations to screen for cervical cancer are no longer recommended for nonpregnant women who are considered low risk for cancer of the pelvic organs (ovaries, uterus, and vagina) and who do not have symptoms. A pelvic examination may be necessary if you have symptoms including those associated with pelvic infections. Ask your health care provider if a screening pelvic exam is right for you.   The Pap test is the screening test for cervical cancer for women who are considered at risk.  If you had a hysterectomy for a problem that was not cancer or a condition that could lead to cancer, then you no longer need Pap tests.  If you are older than 65 years, and you have had normal Pap tests for the past 10 years, you no longer need to have Pap tests.  If you have had past treatment for cervical cancer or a condition that could lead to cancer, you need Pap tests and screening for cancer for at least 20 years after your treatment.  If you no longer get a Pap test, assess your risk factors if they change (such as having a new sexual partner). This can affect whether you should start being screened again.  Some women have medical problems that increase their chance of getting cervical cancer. If this is the case for you, your health care provider may recommend more frequent screening and Pap tests.  The human papillomavirus (HPV) test is another test that may be used for cervical cancer screening. The HPV test looks for the virus that can cause cell changes in the cervix. The cells collected during the Pap test can be tested for HPV.  The HPV test can be used to screen women 57 years of age and older. Getting tested for HPV can extend the interval between normal Pap tests from three to five years.  An HPV  test also should be used to screen women of any age who have unclear Pap test results.  After 33 years of age, women should have HPV testing as often as Pap tests.  Colorectal Cancer  This type of cancer can be detected and often prevented.  Routine colorectal cancer screening usually begins at 34 years of age and continues through 33 years of age.  Your health care provider may recommend screening at an earlier age if you have risk factors for colon cancer.  Your health care provider may also recommend using home test kits to check for hidden blood in the stool.  A small camera at the end of a tube can be used to examine your colon directly (sigmoidoscopy or colonoscopy). This is done to check for the earliest forms of colorectal cancer.  Routine screening usually begins at age 81.  Direct examination of the colon should be repeated every 5-10 years through 33  years of age. However, you may need to be screened more often if early forms of precancerous polyps or small growths are found. Skin Cancer  Check your skin from head to toe regularly.  Tell your health care provider about any new moles or changes in moles, especially if there is a change in a mole's shape or color.  Also tell your health care provider if you have a mole that is larger than the size of a pencil eraser.  Always use sunscreen. Apply sunscreen liberally and repeatedly throughout the day.  Protect yourself by wearing long sleeves, pants, a wide-brimmed hat, and sunglasses whenever you are outside. HEART DISEASE, DIABETES, AND HIGH BLOOD PRESSURE   Have your blood pressure checked at least every 1-2 years. High blood pressure causes heart disease and increases the risk of stroke.  If you are between 28 years and 28 years old, ask your health care provider if you should take aspirin to prevent strokes.  Have regular diabetes screenings. This involves taking a blood sample to check your fasting blood sugar  level.  If you are at a normal weight and have a low risk for diabetes, have this test once every three years after 33 years of age.  If you are overweight and have a high risk for diabetes, consider being tested at a younger age or more often. PREVENTING INFECTION  Hepatitis B  If you have a higher risk for hepatitis B, you should be screened for this virus. You are considered at high risk for hepatitis B if:  You were born in a country where hepatitis B is common. Ask your health care provider which countries are considered high risk.  Your parents were born in a high-risk country, and you have not been immunized against hepatitis B (hepatitis B vaccine).  You have HIV or AIDS.  You use needles to inject street drugs.  You live with someone who has hepatitis B.  You have had sex with someone who has hepatitis B.  You get hemodialysis treatment.  You take certain medicines for conditions, including cancer, organ transplantation, and autoimmune conditions. Hepatitis C  Blood testing is recommended for:  Everyone born from [birth year] through 1965.  Anyone with known risk factors for hepatitis C. Sexually transmitted infections (STIs)  You should be screened for sexually transmitted infections (STIs) including gonorrhea and chlamydia if:  You are sexually active and are younger than 33 years of age.  You are older than 33 years of age and your health care provider tells you that you are at risk for this type of infection.  Your sexual activity has changed since you were last screened and you are at an increased risk for chlamydia or gonorrhea. Ask your health care provider if you are at risk.  If you do not have HIV, but are at risk, it may be recommended that you take a prescription medicine daily to prevent HIV infection. This is called pre-exposure prophylaxis (PrEP). You are considered at risk if:  You are sexually active and do not regularly use condoms or know the HIV status  of your partner(s).  You take drugs by injection.  You are sexually active with a partner who has HIV. Talk with your health care provider about whether you are at high risk of being infected with HIV. If you choose to begin PrEP, you should first be tested for HIV. You should then be tested every 3 months for as long as you are taking PrEP.  PREGNANCY   If you are premenopausal and you may become pregnant, ask your health care provider about preconception counseling.  If you may become pregnant, take 400 to 800 micrograms (mcg) of folic acid every day.  If you want to prevent pregnancy, talk to your health care provider about birth control (contraception). OSTEOPOROSIS AND MENOPAUSE   Osteoporosis is a disease in which the bones lose minerals and strength with aging. This can result in serious bone fractures. Your risk for osteoporosis can be identified using a bone density scan.  If you are 17 years of age or older, or if you are at risk for osteoporosis and fractures, ask your health care provider if you should be screened.  Ask your health care provider whether you should take a calcium or vitamin D supplement to lower your risk for osteoporosis.  Menopause may have certain physical symptoms and risks.  Hormone replacement therapy may reduce some of these symptoms and risks. Talk to your health care provider about whether hormone replacement therapy is right for you.  HOME CARE INSTRUCTIONS   Schedule regular health, dental, and eye exams.  Stay current with your immunizations.   Do not use any tobacco products including cigarettes, chewing tobacco, or electronic cigarettes.  If you are pregnant, do not drink alcohol.  If you are breastfeeding, limit how much and how often you drink alcohol.  Limit alcohol intake to no more than 1 drink per day for nonpregnant women. One drink equals 12 ounces of beer, 5 ounces of wine, or 1 ounces of hard liquor.  Do not use street  drugs.  Do not share needles.  Ask your health care provider for help if you need support or information about quitting drugs.  Tell your health care provider if you often feel depressed.  Tell your health care provider if you have ever been abused or do not feel safe at home. Document Released: 02/03/2011 Document Revised: 12/05/2013 Document Reviewed: 06/22/2013 [facility] Patient Information 2015 [place], [place]. This information is not intended to replace advice given to you by your health care provider. Make sure you discuss any questions you have with your health care provider.

## 2014-03-15 NOTE — Progress Notes (Signed)
Pre visit review using our clinic review tool, if applicable. No additional management support is needed unless otherwise documented below in the visit note. 

## 2014-03-15 NOTE — Progress Notes (Signed)
Subjective:    Patient ID: Elizabeth Carr, female    DOB: [birth year], 33 y.o.   MRN: [id number]  HPI Patient presents to clinic today to establish care. Pt has been using her OBGYN as her primary. Works at [employer].  Acute Concerns: Annual exam.  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  Health Maintenance: Dental -- Dr. Marijo ConceptionSpong. Twice yearly Vision -- Contacts, eye exam yearly, [facility]. Immunizations -- UTD Colonoscopy -- No family history of Colon cancer. PAP -- UTD, done by OBGYN    Review of Systems Patient denies chest pain, shortness of breath, orthopnea. Denies lower extremity edema, abdominal pain, change in appetite, change in bowel movements. Patient denies rashes, musculoskeletal complaints. No other specific complaints in a complete review of systems.    Past Medical History  Diagnosis Date  . Anemia   . Hemochromatosis   . Pregnancy induced hypertension     with first pregnancy  . Ovarian cyst     History   Social History  . Marital Status: Married    Spouse Name: N/A    Number of Children: N/A  . Years of Education: N/A   Occupational History  . Not on file.   Social History Main Topics  . Smoking status: Never Smoker   . Smokeless tobacco: Not on file  . Alcohol Use: Yes     Comment: rare  . Drug Use: No  . Sexual Activity: Not on file   Other Topics Concern  . Not on file   Social History Narrative  . No narrative on file    Past Surgical History  Procedure Laterality Date  . Wisdon teeth extraction    . Cesarean section      x 2  . Cervical biopsy  w/ loop electrode excision      2010    Family History  Problem Relation Age of Onset  . Hypertension Mother   . Hypertension Father   . Hypertension Maternal Grandmother   . Hypothyroidism Maternal Grandmother   . Pancreatic cancer Maternal Grandfather     No Known Allergies  Current Outpatient Prescriptions on File Prior to Visit   Medication Sig Dispense Refill  . norgestimate-ethinyl estradiol (ORTHO-CYCLEN,SPRINTEC,PREVIFEM) 0.25-35 MG-MCG tablet Take 1 tablet by mouth daily.       No current facility-administered medications on file prior to visit.    The PFS history was reviewed with the pt at time of visit.  EXAM: BP 108/80  Temp(Src) 98.4 F (36.9 C) (Oral)  Ht 5' 1.75" (1.568 m)  Wt 129 lb (58.514 kg)  BMI 23.80 kg/m2  LMP 02/27/2014      Objective:   Physical Exam  Nursing note and vitals reviewed. Constitutional: She is oriented to person, place, and time. She appears well-developed and well-nourished. No distress.  HENT:  Head: Normocephalic and atraumatic.  Right Ear: External ear normal.  Left Ear: External ear normal.  Nose: Nose normal.  Mouth/Throat: Oropharynx is clear and moist. No oropharyngeal exudate.  Bilat TMs normal.  Eyes: Conjunctivae and EOM are normal. Pupils are equal, round, and reactive to light. Right eye exhibits no discharge. Left eye exhibits no discharge. No scleral icterus.  Neck: Normal range of motion. Neck supple. No JVD present. No tracheal deviation present. No thyromegaly present.  Cardiovascular: Normal rate, regular rhythm, normal heart sounds and intact distal pulses.  Exam reveals no gallop and no friction rub.   No murmur heard. Pulmonary/Chest: Effort normal and breath sounds  normal. No stridor. No respiratory distress. She has no wheezes. She has no rales. She exhibits no tenderness.  Abdominal: Soft. Bowel sounds are normal. She exhibits no distension and no mass. There is no tenderness. There is no rebound and no guarding.  Genitourinary:  Breast and GYN: Deferred to OB/GYN  Musculoskeletal: Normal range of motion. She exhibits no edema and no tenderness.  Lymphadenopathy:    She has no cervical adenopathy.  Neurological: She is alert and oriented to person, place, and time. She has normal reflexes. No cranial nerve deficit. She exhibits normal  muscle tone. Coordination normal.  Skin: Skin is warm and dry. No rash noted. She is not diaphoretic. No erythema. No pallor.  Psychiatric: She has a normal mood and affect. Her behavior is normal. Judgment and thought content normal.     Lab Results  Component Value Date   WBC 4.4 06/06/2013   HGB 14.0 06/06/2013   HCT 40.9 06/06/2013   PLT 264 06/06/2013   GLUCOSE 84 04/09/2007   ALT 177* 04/09/2007   AST 77* 04/09/2007   NA 139 DELTA CHECK NOTED 04/09/2007   K 4.0 04/09/2007   CL 107 DELTA CHECK NOTED 04/09/2007   CREATININE 0.80 04/09/2007   BUN 13 04/09/2007   CO2 29 04/09/2007         Assessment & Plan:  Elizabeth Carr was seen today for establish care.  Diagnoses and associated orders for this visit:  Annual physical exam Comments: Overall Healthy. Lab panel ordered. Encourage Healthy, varied diet, and regular exercise. - CBC with Differential - CMP - Lipid Panel - POC Urinalysis Dipstick  Hemochromatosis Comments: Managed by Oncology, every 6 months.    Return precautions provided, and patient handout on Health Maintenance.   Plan to follow up as needed for all other concerns, and in approximately one year for an annual physical.  Patient Instructions  Try to keep a healthy, varied diet and regular exercise to maintain your get health.  If emergency symptoms discussed during visit developed, seek medical attention immediately.  Followup as needed for all other concerns, and in approximately one year for an annual physical.

## 2014-03-21 ENCOUNTER — Other Ambulatory Visit (INDEPENDENT_AMBULATORY_CARE_PROVIDER_SITE_OTHER): Payer: 59

## 2014-03-21 DIAGNOSIS — R300 Dysuria: Secondary | ICD-10-CM

## 2014-03-21 LAB — POCT URINALYSIS DIPSTICK
BILIRUBIN UA: NEGATIVE
GLUCOSE UA: NEGATIVE
KETONES UA: NEGATIVE
LEUKOCYTES UA: NEGATIVE
NITRITE UA: NEGATIVE
Protein, UA: NEGATIVE
RBC UA: NEGATIVE
Spec Grav, UA: 1.01
Urobilinogen, UA: 0.2
pH, UA: 6.5

## 2014-03-23 LAB — URINE CULTURE: Colony Count: 25000

## 2014-05-18 ENCOUNTER — Encounter: Payer: Self-pay | Admitting: Physician Assistant

## 2014-05-18 ENCOUNTER — Ambulatory Visit (INDEPENDENT_AMBULATORY_CARE_PROVIDER_SITE_OTHER)
Admission: RE | Admit: 2014-05-18 | Discharge: 2014-05-18 | Disposition: A | Discharge status: Home / Self Care | Payer: 59 | Source: Ambulatory Visit | Attending: Physician Assistant | Admitting: Physician Assistant

## 2014-05-18 ENCOUNTER — Ambulatory Visit (INDEPENDENT_AMBULATORY_CARE_PROVIDER_SITE_OTHER): Payer: 59 | Admitting: Physician Assistant

## 2014-05-18 VITALS — BP 110/82 | HR 60 | Temp 98.0°F | Resp 18 | Wt 127.2 lb

## 2014-05-18 DIAGNOSIS — J209 Acute bronchitis, unspecified: Secondary | ICD-10-CM

## 2014-05-18 MED ORDER — BENZONATATE 200 MG PO CAPS: 200.0000 mg | ORAL_CAPSULE | Freq: Two times a day (BID) | ORAL | Status: DC | PRN

## 2014-05-18 NOTE — Progress Notes (Signed)
Subjective:    Patient ID: Elizabeth Carr, female    DOB: 02/18/1981, 33 y.o.   MRN: [id number]  Cough This is a new problem. The current episode started 1 to 4 weeks ago (4 weeks). The problem has been waxing and waning (Was treated for double OM and sinusitis about 4 weeks ago with ammoxicillin. Symptoms resovled except cough, now cough has progressed.). The problem occurs constantly. The cough is productive of sputum. Associated symptoms include ear congestion, headaches, nasal congestion, postnasal drip and a sore throat. Pertinent negatives include no chest pain, chills, ear pain, fever, heartburn, hemoptysis, myalgias, rash, rhinorrhea, shortness of breath, sweats, weight loss or wheezing. Nothing aggravates the symptoms. Treatments tried: amoxicillin, tylenol, claritin. The treatment provided moderate relief. There is no history of asthma, COPD or environmental allergies.      Review of Systems  Constitutional: Negative for fever, chills and weight loss.  HENT: Positive for congestion, postnasal drip, sinus pressure and sore throat. Negative for ear pain and rhinorrhea.   Respiratory: Positive for cough. Negative for hemoptysis, shortness of breath and wheezing.   Cardiovascular: Negative for chest pain.  Gastrointestinal: Negative for heartburn, nausea, vomiting and diarrhea.  Musculoskeletal: Negative for myalgias.  Skin: Negative for rash.  Allergic/Immunologic: Negative for environmental allergies.  Neurological: Positive for headaches. Negative for syncope.  All other systems reviewed and are negative.    Past Medical History  Diagnosis Date  . Anemia   . Hemochromatosis   . Pregnancy induced hypertension     with first pregnancy  . Ovarian cyst     History   Social History  . Marital Status: Married    Spouse Name: N/A    Number of Children: N/A  . Years of Education: N/A   Occupational History  . Not on file.   Social History Main Topics  . Smoking  status: Never Smoker   . Smokeless tobacco: Not on file  . Alcohol Use: Yes     Comment: rare  . Drug Use: No  . Sexual Activity: Not on file   Other Topics Concern  . Not on file   Social History Narrative  . No narrative on file    Past Surgical History  Procedure Laterality Date  . Wisdon teeth extraction    . Cesarean section      x 2  . Cervical biopsy  w/ loop electrode excision      2010    Family History  Problem Relation Age of Onset  . Hypertension Mother   . Hypertension Father   . Hypertension Maternal Grandmother   . Hypothyroidism Maternal Grandmother   . Pancreatic cancer Maternal Grandfather     No Known Allergies  Current Outpatient Prescriptions on File Prior to Visit  Medication Sig Dispense Refill  . norgestimate-ethinyl estradiol (ORTHO-CYCLEN,SPRINTEC,PREVIFEM) 0.25-35 MG-MCG tablet Take 1 tablet by mouth daily.       No current facility-administered medications on file prior to visit.    EXAM: BP 110/82  Pulse 60  Temp(Src) 98 F (36.7 C) (Oral)  Resp 18  Wt 127 lb 3.2 oz (57.698 kg)  LMP 05/03/2014     Objective:   Physical Exam  Nursing note and vitals reviewed. Constitutional: She is oriented to person, place, and time. She appears well-developed and well-nourished. No distress.  HENT:  Head: Normocephalic and atraumatic.  Right Ear: External ear normal.  Left Ear: External ear normal.  Nose: Nose normal.  Mouth/Throat: No oropharyngeal exudate.  Oropharynx is  slightly erythematous, no exudate. Bilateral TMs normal. Bilateral frontal and maxillary sinuses non-TTP.  Eyes: Conjunctivae and EOM are normal.  Neck: Normal range of motion. Neck supple.  Cardiovascular: Normal rate, regular rhythm and intact distal pulses.   Pulmonary/Chest: Effort normal. No stridor. No respiratory distress. She has no wheezes. She has no rales. She exhibits no tenderness.  Mild rhonchi heard bilateral lung bases.  Lymphadenopathy:    She has  no cervical adenopathy.  Neurological: She is alert and oriented to person, place, and time.  Skin: Skin is warm and dry. She is not diaphoretic. No pallor.  Psychiatric: She has a normal mood and affect. Her behavior is normal. Judgment and thought content normal.     Lab Results  Component Value Date   WBC 4.9 03/15/2014   HGB 12.9 03/15/2014   HCT 37.9 03/15/2014   PLT 206.0 03/15/2014   GLUCOSE 79 03/15/2014   CHOL 175 03/15/2014   TRIG 80.0 03/15/2014   HDL 90.00 03/15/2014   LDLCALC 69 03/15/2014   ALT 14 03/15/2014   AST 16 03/15/2014   NA 139 03/15/2014   K 3.6 03/15/2014   CL 107 03/15/2014   CREATININE 0.7 03/15/2014   BUN 10 03/15/2014   CO2 27 03/15/2014        Assessment & Plan:  Gavin PoundDeborah was seen today for cough.  Diagnoses and associated orders for this visit:  Acute bronchitis, unspecified organism Comments: CXR due to length of cough, Tessalon perles for cough, OTC mucinex, nasal steroid, antihistamine, rest, push fluids, watchful waiting. - benzonatate (TESSALON) 200 MG capsule; Take 1 capsule (200 mg total) by mouth 2 (two) times daily as needed for cough. - DG Chest 2 View; Future    Return precautions provided, and patient handout on bronchitis.  Plan to follow up as needed, or for worsening or persistent symptoms despite treatment.  Patient Instructions  [facility]  Tessalon Perles twice daily as needed for cough.  Plain Over the Counter Mucinex (NOT Mucinex D) for thick secretions  Force NON dairy fluids, drinking plenty of water is best.    Over the Counter Flonase OR Nasacort AQ 1 spray in each nostril twice a day as needed. Use the "crossover" technique into opposite nostril spraying toward opposite ear @ 45 degree angle, not straight up into nostril.   Plain Over the Counter Allegra (NOT D )  160 daily , OR Loratidine 10 mg , OR Zyrtec 10 mg @ bedtime  as needed for itchy eyes & sneezing.  Saline Irrigation and Saline  Sprays can also help reduce symptoms.  If emergency symptoms discussed during visit developed, seek medical attention immediately.  Followup as needed, or for worsening or persistent symptoms despite treatment.

## 2014-05-18 NOTE — Patient Instructions (Addendum)
[facility].  Tessalon Perles twice daily as needed for cough.  Plain Over the Counter Mucinex (NOT Mucinex D) for thick secretions  Force NON dairy fluids, drinking plenty of water is best.    Over the Counter Flonase OR Nasacort AQ 1 spray in each nostril twice a day as needed. Use the "crossover" technique into opposite nostril spraying toward opposite ear @ 45 degree angle, not straight up into nostril.   Plain Over the Counter Allegra (NOT D )  160 daily , OR Loratidine 10 mg , OR Zyrtec 10 mg @ bedtime  as needed for itchy eyes & sneezing.  Saline Irrigation and Saline Sprays can also help reduce symptoms.  If emergency symptoms discussed during visit developed, seek medical attention immediately.  Followup as needed, or for worsening or persistent symptoms despite treatment.      Acute Bronchitis Bronchitis is when the airways that extend from the windpipe into the lungs get red, puffy, and painful (inflamed). Bronchitis often causes thick spit (mucus) to develop. This leads to a cough. A cough is the most common symptom of bronchitis. In acute bronchitis, the condition usually begins suddenly and goes away over time (usually in 2 weeks). Smoking, allergies, and asthma can make bronchitis worse. Repeated episodes of bronchitis may cause more lung problems. HOME CARE  Rest.  Drink enough fluids to keep your pee (urine) clear or pale yellow (unless you need to limit fluids as told by your doctor).  Only take over-the-counter or prescription medicines as told by your doctor.  Avoid smoking and secondhand smoke. These can make bronchitis worse. If you are a smoker, think about using nicotine gum or skin patches. Quitting smoking will help your lungs heal faster.  Reduce the chance of getting bronchitis again by:  Washing your hands often.  Avoiding people with cold symptoms.  Trying not to touch your hands to your mouth, nose, or eyes.  Follow up  with your doctor as told. GET HELP IF: Your symptoms do not improve after 1 week of treatment. Symptoms include:  Cough.  Fever.  Coughing up thick spit.  Body aches.  Chest congestion.  Chills.  Shortness of breath.  Sore throat. GET HELP RIGHT AWAY IF:   You have an increased fever.  You have chills.  You have severe shortness of breath.  You have bloody thick spit (sputum).  You throw up (vomit) often.  You lose too much body fluid (dehydration).  You have a severe headache.  You faint. MAKE SURE YOU:   Understand these instructions.  Will watch your condition.  Will get help right away if you are not doing well or get worse. Document Released: 01/07/2008 Document Revised: 03/23/2013 Document Reviewed: 01/11/2013 [facility] Patient Information 2015 [place] [place]. This information is not intended to replace advice given to you by your health care provider. Make sure you discuss any questions you have with your health care provider.

## 2014-05-26 ENCOUNTER — Ambulatory Visit: Payer: 59 | Admitting: Family Medicine

## 2014-05-26 ENCOUNTER — Encounter: Payer: Self-pay | Admitting: Family Medicine

## 2014-05-26 ENCOUNTER — Ambulatory Visit (INDEPENDENT_AMBULATORY_CARE_PROVIDER_SITE_OTHER): Payer: 59 | Admitting: Family Medicine

## 2014-05-26 ENCOUNTER — Telehealth: Payer: Self-pay | Admitting: Physician Assistant

## 2014-05-26 VITALS — BP 126/78 | HR 71 | Temp 97.8°F | Wt 127.0 lb

## 2014-05-26 DIAGNOSIS — R059 Cough, unspecified: Secondary | ICD-10-CM

## 2014-05-26 DIAGNOSIS — R05 Cough: Secondary | ICD-10-CM

## 2014-05-26 MED ORDER — LEVOFLOXACIN 500 MG PO TABS: 500.0000 mg | ORAL_TABLET | Freq: Every day | ORAL | Status: DC

## 2014-05-26 NOTE — Progress Notes (Signed)
   Subjective:    Patient ID: Elizabeth Carr, female    DOB: Dec 20, 1980, 33 y.o.   MRN: [id number]  Cough Pertinent negatives include no chills, fever, headaches, shortness of breath or wheezing.   33 year old nonsmoker seen with 5 weeks of cough. She initially developed some facial pressure and sinus congestion back in September with colored nasal discharge. She was treated with amoxicillin by physician elsewhere. She had some improvement in nasal symptoms but subsequently developed cough. She has cough which is productive of green mucus early in the mornings and tends to be dry later in the day. She was seen last week and prescribed Tessalon Perles which helped slightly with cough. Chest x-ray then revealed no acute findings. She denies any pleuritic pain. No hemoptysis. No GERD symptoms. She thought she had some mild wheezing this morning but was given albuterol which did not seem to help.  No documented fever. No night sweats. No appetite or weight changes. She did note some mild left lower chest pains with deep inspiration. No dyspnea.  Past Medical History  Diagnosis Date  . Anemia   . Hemochromatosis   . Pregnancy induced hypertension     with first pregnancy  . Ovarian cyst    Past Surgical History  Procedure Laterality Date  . Wisdon teeth extraction    . Cesarean section      x 2  . Cervical biopsy  w/ loop electrode excision      2010    reports that she has never smoked. She does not have any smokeless tobacco history on file. She reports that she drinks alcohol. She reports that she does not use illicit drugs. family history includes Hypertension in her father, maternal grandmother, and mother; Hypothyroidism in her maternal grandmother; Pancreatic cancer in her maternal grandfather. No Known Allergies    Review of Systems  Constitutional: Negative for fever, chills, appetite change and unexpected weight change.  HENT: Positive for congestion and sinus pressure.     Respiratory: Positive for cough. Negative for shortness of breath and wheezing.   Gastrointestinal: Negative for nausea and vomiting.  Neurological: Negative for headaches.       Objective:   Physical Exam  Constitutional: She appears well-developed and well-nourished.  Cardiovascular: Normal rate and regular rhythm.   Pulmonary/Chest: Effort normal. No respiratory distress.  Patient has good air movement throughout. No retractions. ?inspiratory rub left lung base.  Few faint crackles left base.          Assessment & Plan:  Persistent productive cough over 5 weeks duration. She has some ongoing sinusitis symptoms and finding today of some faint crackles and ? Pleural rub left base.  Recent chest x-ray unremarkable. Given findings above, Levaquin 500 milligrams once daily for 10 days. Touch base promptly for any fever or worsening symptoms. Consider repeat chest x-ray next week if no improvement.

## 2014-05-26 NOTE — Telephone Encounter (Signed)
--  Follow-up if symptoms persist

## 2014-05-26 NOTE — Telephone Encounter (Signed)
appt scheduled

## 2014-05-26 NOTE — Progress Notes (Signed)
Pre visit review using our clinic review tool, if applicable. No additional management support is needed unless otherwise documented below in the visit note. 

## 2014-05-26 NOTE — Telephone Encounter (Signed)
pls advise and call pt

## 2014-05-26 NOTE — Telephone Encounter (Signed)
Can you please make a follow up for patient.

## 2014-05-26 NOTE — Telephone Encounter (Signed)
Pt seen on 10/15. Matt sent pt for chest xray. Pt states she still has a pretty bad cough. Matt was considering zpak, but thought pt would be better. So waitied. Generally feels better. But now has soreness in chest from coughing and to deep breathe. Do you want pt to come back, or try another abx.  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 States no crackling, but not clear,. [facility]

## 2014-06-05 ENCOUNTER — Other Ambulatory Visit (HOSPITAL_COMMUNITY): Payer: 59

## 2014-06-05 ENCOUNTER — Ambulatory Visit (HOSPITAL_COMMUNITY): Payer: 59

## 2014-06-06 ENCOUNTER — Encounter (HOSPITAL_COMMUNITY): Payer: Self-pay

## 2014-06-06 ENCOUNTER — Encounter (HOSPITAL_BASED_OUTPATIENT_CLINIC_OR_DEPARTMENT_OTHER): Payer: 59

## 2014-06-06 ENCOUNTER — Encounter (HOSPITAL_COMMUNITY): Payer: 59 | Attending: Hematology and Oncology

## 2014-06-06 DIAGNOSIS — E611 Iron deficiency: Secondary | ICD-10-CM | POA: Insufficient documentation

## 2014-06-06 DIAGNOSIS — D649 Anemia, unspecified: Secondary | ICD-10-CM

## 2014-06-06 LAB — IRON AND TIBC
IRON: 82 ug/dL (ref 42–135)
Saturation Ratios: 29 % (ref 20–55)
TIBC: 282 ug/dL (ref 250–470)
UIBC: 200 ug/dL (ref 125–400)

## 2014-06-06 LAB — FERRITIN: Ferritin: 12 ng/mL (ref 10–291)

## 2014-06-06 LAB — CBC WITH DIFFERENTIAL/PLATELET
BASOS PCT: 0 % (ref 0–1)
Basophils Absolute: 0 10*3/uL (ref 0.0–0.1)
EOS ABS: 0.2 10*3/uL (ref 0.0–0.7)
Eosinophils Relative: 4 % (ref 0–5)
HCT: 37.6 % (ref 36.0–46.0)
Hemoglobin: 13.2 g/dL (ref 12.0–15.0)
Lymphocytes Relative: 49 % — ABNORMAL HIGH (ref 12–46)
Lymphs Abs: 2.3 10*3/uL (ref 0.7–4.0)
MCH: 30.6 pg (ref 26.0–34.0)
MCHC: 35.1 g/dL (ref 30.0–36.0)
MCV: 87 fL (ref 78.0–100.0)
Monocytes Absolute: 0.4 10*3/uL (ref 0.1–1.0)
Monocytes Relative: 8 % (ref 3–12)
NEUTROS PCT: 39 % — AB (ref 43–77)
Neutro Abs: 1.8 10*3/uL (ref 1.7–7.7)
PLATELETS: 238 10*3/uL (ref 150–400)
RBC: 4.32 MIL/uL (ref 3.87–5.11)
RDW: 12.7 % (ref 11.5–15.5)
WBC: 4.6 10*3/uL (ref 4.0–10.5)

## 2014-06-06 NOTE — Patient Instructions (Signed)
[facility] Discharge Instructions  RECOMMENDATIONS MADE BY THE CONSULTANT AND ANY TEST RESULTS WILL BE SENT TO YOUR REFERRING PHYSICIAN.  EXAM FINDINGS BY THE PHYSICIAN TODAY AND SIGNS OR SYMPTOMS TO REPORT TO CLINIC OR PRIMARY PHYSICIAN: You saw Dr Zigmund DanielFormanek today  MEDICATIONS PRESCRIBED:  No new medications  SPECIAL INSTRUCTIONS/FOLLOW-UP: Follow up in 1 year with lab work   Thank you for choosing [facility] to provide your oncology and hematology care.  To afford each patient quality time with our providers, please arrive at least 15 minutes before your scheduled appointment time.  With your help, our goal is to use those 15 minutes to complete the necessary work-up to ensure our physicians have the information they need to help with your evaluation and healthcare recommendations.    Effective January 1st, 2014, we ask that you re-schedule your appointment with our physicians should you arrive 10 or more minutes late for your appointment.  We strive to give you quality time with our providers, and arriving late affects you and other patients whose appointments are after yours.    Again, thank you for choosing [facility].  Our hope is that these requests will decrease the amount of time that you wait before being seen by our physicians.       _____________________________________________________________  Should you have questions after your visit to [facility], please contact our office at [phone] between the hours of 8:30 a.m. and 5:00 p.m.  Voicemails left after 4:30 p.m. will not be returned until the following business day.  For prescription refill requests, have your pharmacy contact our office with your prescription refill request.

## 2014-06-06 NOTE — Progress Notes (Signed)
[facility]  OFFICE PROGRESS NOTE  Tucker,Matthew K, PA-C No address on file  DIAGNOSIS: Hemochromatosis - Plan: CBC with Differential, Iron and TIBC, Ferritin, CBC with Differential, Ferritin  Chief Complaint  Patient presents with  . Hemochromatosis    CURRENT THERAPY: periodic phlebotomy  INTERVAL HISTORY: Elizabeth Carr 33 y.o. female returns for follow-up of hemochromatosis, still menstruating, not necessarily requiring phlebotomy. Last ferritin done on 06/06/2013 was 9.  She continues to do well with excellent appetite and no episodes of nausea, vomiting, diarrhea, constipation, dysuria, hematuria, lower extremity swelling or redness, skin rash, headache, or seizures. Her last menstrual. He was 2 weeks ago lasting 5 days. She is not craving ice.  MEDICAL HISTORY: Past Medical History  Diagnosis Date  . Anemia   . Hemochromatosis   . Pregnancy induced hypertension     with first pregnancy  . Ovarian cyst     INTERIM HISTORY: has Hemochromatosis on her problem list.    ALLERGIES:  has No Known Allergies.  MEDICATIONS: has a current medication list which includes the following prescription(s): norgestimate-ethinyl estradiol, benzonatate, and levofloxacin.  SURGICAL HISTORY:  Past Surgical History  Procedure Laterality Date  . Wisdon teeth extraction    . Cesarean section      x 2  . Cervical biopsy  w/ loop electrode excision      2010    FAMILY HISTORY: family history includes Hypertension in her father, maternal grandmother, and mother; Hypothyroidism in her maternal grandmother; Pancreatic cancer in her maternal grandfather.  SOCIAL HISTORY:  reports that she has never smoked. She does not have any smokeless tobacco history on file. She reports that she drinks alcohol. She reports that she does not use illicit drugs.  REVIEW OF SYSTEMS:  Other than that discussed above is noncontributory.  PHYSICAL  EXAMINATION: ECOG PERFORMANCE STATUS: 0 - Asymptomatic  Blood pressure 117/82, pulse 70, temperature 98.2 F (36.8 C), temperature source Oral, resp. rate 16, weight 126 lb 3.2 oz (57.244 kg), last menstrual period 05/03/2014, SpO2 100 %.  GENERAL:alert, no distress and comfortable SKIN: skin color, texture, turgor are normal, no rashes or significant lesions EYES: PERLA; Conjunctiva are pink and non-injected, sclera clear SINUSES: No redness or tenderness over maxillary or ethmoid sinuses OROPHARYNX:no exudate, no erythema on lips, buccal mucosa, or tongue. NECK: supple, thyroid normal size, non-tender, without nodularity. No masses CHEST: normal AP diameter. LYMPH:  no palpable lymphadenopathy in the cervical, axillary or inguinal LUNGS: clear to auscultation and percussion with normal breathing effort HEART: regular rate & rhythm and no murmurs. ABDOMEN:abdomen soft, non-tender and normal bowel sounds MUSCULOSKELETAL:no cyanosis of digits and no clubbing. Range of motion normal.  NEURO: alert & oriented x 3 with fluent speech, no focal motor/sensory deficits   LABORATORY DATA: Lab on 06/06/2014  Component Date Value Ref Range Status  . WBC 06/06/2014 4.6  4.0 - 10.5 K/uL Final  . RBC 06/06/2014 4.32  3.87 - 5.11 MIL/uL Final  . Hemoglobin 06/06/2014 13.2  12.0 - 15.0 g/dL Final  . HCT 11/91/478211/10/2013 37.6  36.0 - 46.0 % Final  . MCV 06/06/2014 87.0  78.0 - 100.0 fL Final  . MCH 06/06/2014 30.6  26.0 - 34.0 pg Final  . MCHC 06/06/2014 35.1  30.0 - 36.0 g/dL Final  . RDW 95/62/130811/10/2013 12.7  11.5 - 15.5 % Final  . Platelets 06/06/2014 238  150 - 400 K/uL Final  . Neutrophils Relative % 06/06/2014  39* 43 - 77 % Final  . Neutro Abs 06/06/2014 1.8  1.7 - 7.7 K/uL Final  . Lymphocytes Relative 06/06/2014 49* 12 - 46 % Final  . Lymphs Abs 06/06/2014 2.3  0.7 - 4.0 K/uL Final  . Monocytes Relative 06/06/2014 8  3 - 12 % Final  . Monocytes Absolute 06/06/2014 0.4  0.1 - 1.0 K/uL Final  .  Eosinophils Relative 06/06/2014 4  0 - 5 % Final  . Eosinophils Absolute 06/06/2014 0.2  0.0 - 0.7 K/uL Final  . Basophils Relative 06/06/2014 0  0 - 1 % Final  . Basophils Absolute 06/06/2014 0.0  0.0 - 0.1 K/uL Final    PATHOLOGY:no new pathology.  Urinalysis    Component Value Date/Time   COLORURINE YELLOW 04/06/2007 2330   APPEARANCEUR CLEAR 04/06/2007 2330   LABSPEC >1.030* 04/07/2007 1016   PHURINE 5.5 04/07/2007 1016   GLUCOSEU NEGATIVE 04/07/2007 1016   HGBUR SMALL* 04/07/2007 1016   BILIRUBINUR n 03/21/2014 1255   BILIRUBINUR NEGATIVE 04/07/2007 1016   KETONESUR NEGATIVE 04/07/2007 1016   PROTEINUR n 03/21/2014 1255   PROTEINUR NEGATIVE 04/07/2007 1016   UROBILINOGEN 0.2 03/21/2014 1255   UROBILINOGEN 0.2 04/07/2007 1016   NITRITE n 03/21/2014 1255   NITRITE NEGATIVE 04/07/2007 1016   LEUKOCYTESUR Negative 03/21/2014 1255    RADIOGRAPHIC STUDIES: Dg Chest 2 View  05/18/2014   CLINICAL DATA:  Cough and congestion for 4 weeks.  EXAM: CHEST  2 VIEW  COMPARISON:  08/11/2011  FINDINGS: The heart size and mediastinal contours are within normal limits. Both lungs are clear. The visualized skeletal structures are unremarkable.  IMPRESSION: No active cardiopulmonary disease.   Electronically Signed   By: Ruel Favorsrevor  Shick M.D.   On: 05/18/2014 17:16    ASSESSMENT:  #1. Hemochromatosis, stable, regular menstrual periods resulting in iron deficiency state without anemia.   PLAN:  #1. No further intervention is necessary. #2. Follow-up in one year with CBC and ferritin.   All questions were answered. The patient knows to call the clinic with any problems, questions or concerns. We can certainly see the patient much sooner if necessary.   I spent 25 minutes counseling the patient face to face. The total time spent in the appointment was 30 minutes.    Maurilio LovelyFormanek, Abdullah Rizzi A, MD 06/06/2014 10:12 AM  DISCLAIMER:  This note was dictated with voice recognition software.  Similar  sounding words can inadvertently be transcribed inaccurately and may not be corrected upon review.

## 2014-06-06 NOTE — Progress Notes (Signed)
LABS FOR FERR,IRON/TIBC,CBCD 

## 2014-07-21 ENCOUNTER — Encounter: Payer: Self-pay | Admitting: Family Medicine

## 2014-07-21 ENCOUNTER — Ambulatory Visit (INDEPENDENT_AMBULATORY_CARE_PROVIDER_SITE_OTHER): Payer: 59 | Admitting: Family Medicine

## 2014-07-21 VITALS — BP 118/80 | HR 74 | Temp 97.3°F | Ht 61.75 in | Wt 128.3 lb

## 2014-07-21 DIAGNOSIS — J069 Acute upper respiratory infection, unspecified: Secondary | ICD-10-CM

## 2014-07-21 DIAGNOSIS — H73011 Bullous myringitis, right ear: Secondary | ICD-10-CM

## 2014-07-21 MED ORDER — AZITHROMYCIN 250 MG PO TABS: ORAL_TABLET | ORAL | Status: DC

## 2014-07-21 NOTE — Progress Notes (Signed)
Pre visit review using our clinic review tool, if applicable. No additional management support is needed unless otherwise documented below in the visit note. 

## 2014-07-21 NOTE — Progress Notes (Addendum)
HPI:  R ear pain -symptoms:nasal congestion, sore throat, cough for 3 days and then R ear pain started yesterday -denies:fever, SOB, NVD, tooth pain, sinus pain -has tried: nothing -sick contacts/travel/risks: denies flu exposure, tick exposure or or Ebola risks, strep exposure at home -Hx of: ear infection  ROS: See pertinent positives and negatives per HPI.  Past Medical History  Diagnosis Date  . Anemia   . Hemochromatosis   . Pregnancy induced hypertension     with first pregnancy  . Ovarian cyst     Past Surgical History  Procedure Laterality Date  . Wisdon teeth extraction    . Cesarean section      x 2  . Cervical biopsy  w/ loop electrode excision      2010    Family History  Problem Relation Age of Onset  . Hypertension Mother   . Hypertension Father   . Hypertension Maternal Grandmother   . Hypothyroidism Maternal Grandmother   . Pancreatic cancer Maternal Grandfather     History   Social History  . Marital Status: Married    Spouse Name: N/A    Number of Children: N/A  . Years of Education: N/A   Social History Main Topics  . Smoking status: Never Smoker   . Smokeless tobacco: None  . Alcohol Use: Yes     Comment: rare  . Drug Use: No  . Sexual Activity: None   Other Topics Concern  . None   Social History Narrative    Current outpatient prescriptions: norgestimate-ethinyl estradiol (ORTHO-CYCLEN,SPRINTEC,PREVIFEM) 0.25-35 MG-MCG tablet, Take 1 tablet by mouth daily., Disp: , Rfl: ;  azithromycin (ZITHROMAX) 250 MG tablet, 2 tabs on day 1 then 1 tab daily, Disp: 6 tablet, Rfl: 0  EXAM:  Filed Vitals:   07/21/14 1407  BP: 118/80  Pulse: 74  Temp: 97.3 F (36.3 C)    Body mass index is 23.67 kg/(m^2).  GENERAL: vitals reviewed and listed above, alert, oriented, appears well hydrated and in no acute distress  HEENT: atraumatic, conjunttiva clear, no obvious abnormalities on inspection of external nose and ears, normal appearance  of ear canals and TMs, R TM with bullous lesion, a little dull, L ear normal, clear nasal congestion, mild post oropharyngeal erythema with PND, no tonsillar edema or exudate, no sinus TTP  NECK: no obvious masses on inspection  LUNGS: clear to auscultation bilaterally, no wheezes, rales or rhonchi, good air movement  CV: HRRR, no peripheral edema  MS: moves all extremities without noticeable abnormality  PSYCH: pleasant and cooperative, no obvious depression or anxiety  ASSESSMENT AND PLAN:  Discussed the following assessment and plan:  Bullous myringitis, right - Plan: azithromycin (ZITHROMAX) 250 MG tablet  Acute upper respiratory infection - Plan: azithromycin (ZITHROMAX) 250 MG tablet  -We discussed potential etiologies, with VURI being most likely though bacterial organisms sometimes cause this as well. We discussed treatment side effects, likely course,  transmission, and signs of developing a serious illness. -of course, we advised to return or notify a doctor immediately if symptoms worsen or persist or new concerns arise.    Patient Instructions  -As we discussed, we have prescribed a new medication for you at this appointment. We discussed the common and serious potential adverse effects of this medication and you can review these and more with the pharmacist when you pick up your medication.  Please follow the instructions for use carefully and notify us immediately if you have any problems taking this medication.  Colin Benton R.

## 2014-07-21 NOTE — Patient Instructions (Signed)

## 2015-06-07 ENCOUNTER — Encounter (HOSPITAL_BASED_OUTPATIENT_CLINIC_OR_DEPARTMENT_OTHER): Payer: 59 | Admitting: Oncology

## 2015-06-07 ENCOUNTER — Ambulatory Visit (HOSPITAL_COMMUNITY): Payer: Self-pay | Admitting: Hematology & Oncology

## 2015-06-07 ENCOUNTER — Encounter (HOSPITAL_COMMUNITY): Payer: 59 | Attending: Oncology

## 2015-06-07 DIAGNOSIS — E611 Iron deficiency: Secondary | ICD-10-CM

## 2015-06-07 LAB — CBC WITH DIFFERENTIAL/PLATELET
BASOS ABS: 0 10*3/uL (ref 0.0–0.1)
BASOS PCT: 0 %
EOS ABS: 0.1 10*3/uL (ref 0.0–0.7)
EOS PCT: 2 %
HCT: 37.5 % (ref 36.0–46.0)
HEMOGLOBIN: 12.7 g/dL (ref 12.0–15.0)
LYMPHS ABS: 2.1 10*3/uL (ref 0.7–4.0)
Lymphocytes Relative: 55 %
MCH: 30.1 pg (ref 26.0–34.0)
MCHC: 33.9 g/dL (ref 30.0–36.0)
MCV: 88.9 fL (ref 78.0–100.0)
Monocytes Absolute: 0.3 10*3/uL (ref 0.1–1.0)
Monocytes Relative: 8 %
Neutro Abs: 1.3 10*3/uL — ABNORMAL LOW (ref 1.7–7.7)
Neutrophils Relative %: 35 %
PLATELETS: 233 10*3/uL (ref 150–400)
RBC: 4.22 MIL/uL (ref 3.87–5.11)
RDW: 12.6 % (ref 11.5–15.5)
WBC: 3.7 10*3/uL — ABNORMAL LOW (ref 4.0–10.5)

## 2015-06-07 LAB — FERRITIN: Ferritin: 6 ng/mL — ABNORMAL LOW (ref 11–307)

## 2015-06-07 NOTE — Progress Notes (Signed)
Elizabeth Carr,Elizabeth K, PA-C No address on file  Hemochromatosis - Plan: CBC with Differential, Ferritin  CURRENT THERAPY: Observation  INTERVAL HISTORY: Elizabeth Carr 34 y.o. 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   I personally reviewed and went over laboratory results with the patient.  The results are noted within this dictation.  Labs will be updated today.  Last year, her ferritin was 12 with a normal Hgb of 13.2 g/dL.  Hgb today is 12.7 g/dL.  This is my first encounter with the patient.  Her WBC is always low-normal and therefore her minimal leukopenia is not impressive or concerning.    She reports that she feels well.  She denies any complaints today.  I provided minimal education regarding hemochromatosis.  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  She knows that while she is menstruating, her iron level may remain low which is the goal of treatment for hemochromatosis.   Past Medical History  Diagnosis Date  . Anemia   . Hemochromatosis   . Pregnancy induced hypertension     with first pregnancy  . Ovarian cyst     has Hemochromatosis on her problem list.     has No Known Allergies.  Current Outpatient Prescriptions on File Prior to Visit  Medication Sig Dispense Refill  . norgestimate-ethinyl estradiol (ORTHO-CYCLEN,SPRINTEC,PREVIFEM) 0.25-35 MG-MCG tablet Take 1 tablet by mouth daily.    Marland Kitchen azithromycin (ZITHROMAX) 250 MG tablet 2 tabs on day 1 then 1 tab daily (Patient not taking: Reported on 06/07/2015) 6 tablet 0   No current facility-administered medications on file prior to visit.    Past Surgical History  Procedure Laterality Date  . Wisdon teeth extraction    . Cesarean section      x 2  . Cervical biopsy  w/ loop electrode excision      2010    Denies any  headaches, dizziness, double vision, fevers, chills, night sweats, nausea, vomiting, diarrhea, constipation, chest pain, heart palpitations, shortness of breath, blood in stool, black tarry stool, urinary pain, urinary burning, urinary frequency, hematuria.   PHYSICAL EXAMINATION  ECOG PERFORMANCE STATUS: 0 - Asymptomatic  Filed Vitals:   06/07/15 0900  BP: 125/78  Pulse: 55  Temp: 98.1 F (36.7 C)  Resp: 18    GENERAL:alert, no distress, well nourished, well developed, comfortable, cooperative, smiling and unaccompanied SKIN: skin color, texture, turgor are normal, no rashes or significant lesions HEAD: Normocephalic, No masses, lesions, tenderness or abnormalities EYES: normal, PERRLA, EOMI, Conjunctiva are pink and non-injected EARS: External ears normal OROPHARYNX:lips, buccal mucosa, and tongue normal and mucous membranes are moist  NECK: supple, no adenopathy, thyroid normal size, non-tender, without nodularity, no stridor, non-tender, trachea midline LYMPH:  no palpable lymphadenopathy, no hepatosplenomegaly BREAST:not examined LUNGS: clear to auscultation and percussion HEART: regular rate & rhythm, no murmurs and no gallops ABDOMEN:abdomen soft, non-tender, normal bowel sounds and no masses or organomegaly BACK: Back symmetric, no curvature., No CVA tenderness EXTREMITIES:less then 2 second capillary refill, no joint deformities, effusion, or inflammation, no skin discoloration, no clubbing, no cyanosis  NEURO: alert & oriented x 3 with fluent speech, no focal motor/sensory deficits, gait normal   LABORATORY DATA: CBC    Component Value Date/Time   WBC 3.7* 06/07/2015 0856   RBC  4.22 06/07/2015 0856   HGB 12.7 06/07/2015 0856   HCT 37.5 06/07/2015 0856   PLT 233 06/07/2015 0856   MCV 88.9 06/07/2015 0856   MCH 30.1 06/07/2015 0856   MCHC 33.9 06/07/2015 0856   RDW 12.6 06/07/2015 0856   LYMPHSABS 2.1 06/07/2015 0856   MONOABS 0.3 06/07/2015 0856   EOSABS 0.1  06/07/2015 0856   BASOSABS 0.0 06/07/2015 0856      Chemistry      Component Value Date/Time   NA 139 03/15/2014 0839   Carr 3.6 03/15/2014 0839   CL 107 03/15/2014 0839   CO2 27 03/15/2014 0839   BUN 10 03/15/2014 0839   CREATININE 0.7 03/15/2014 0839      Component Value Date/Time   CALCIUM 9.0 03/15/2014 0839   ALKPHOS 43 03/15/2014 0839   AST 16 03/15/2014 0839   ALT 14 03/15/2014 0839   BILITOT 0.6 03/15/2014 0839     Lab Results  Component Value Date   IRON 82 06/06/2014   TIBC 282 06/06/2014   FERRITIN 12 06/06/2014      PENDING LABS:   RADIOGRAPHIC STUDIES:  No results found.   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    She has not required phlebotomies as ferritin has been below 50 secondary to menstruation.  Once post-menopausal, she may require regular phlebotomies to maintain ferritin at 50 or less.  Labs today: CBC diff, ferritin  Labs in 12 months: CBC ferritin  Return in 12 months for follow-up, sooner if needed.   THERAPY PLAN:  Continue observation as above.  All questions were answered. The patient knows to call the clinic with any problems, questions or concerns. We can certainly see the patient much sooner if necessary.  Patient and plan discussed with Dr. Loma MessingShannon Penland and she is in agreement with the aforementioned.   This note is electronically signed by: Tina GriffithsKEFALAS,Elizabeth Mcglothen, PA-C 06/07/2015 11:31 AM

## 2015-06-07 NOTE — Assessment & Plan Note (Signed)
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    She has not required phlebotomies as ferritin has been below 50 secondary to menstruation.  Once post-menopausal, she may require regular phlebotomies to maintain ferritin at 50 or less.  Labs today: CBC diff, ferritin  Labs in 12 months: CBC ferritin  Return in 12 months for follow-up, sooner if needed.

## 2015-06-07 NOTE — Patient Instructions (Signed)
..  [facility] Discharge Instructions  RECOMMENDATIONS MADE BY THE CONSULTANT AND ANY TEST RESULTS WILL BE SENT TO YOUR REFERRING PHYSICIAN.  Labs in 12 months and return to see Dr. In 12 months  Thank you for choosing  [facility] to provide your oncology and hematology care.  To afford each patient quality time with our provider, please arrive at least 15 minutes before your scheduled appointment time.    You need to re-schedule your appointment should you arrive 10 or more minutes late.  We strive to give you quality time with our providers, and arriving late affects you and other patients whose appointments are after yours.  Also, if you no show three or more times for appointments you may be dismissed from the clinic at the providers discretion.     Again, thank you for choosing [facility].  Our hope is that these requests will decrease the amount of time that you wait before being seen by our physicians.       _____________________________________________________________  Should you have questions after your visit to [facility], please contact our office at [phone] between the hours of 8:30 a.m. and 4:30 p.m.  Voicemails left after 4:30 p.m. will not be returned until the following business day.  For prescription refill requests, have your pharmacy contact our office.

## 2015-06-08 NOTE — Progress Notes (Signed)
LABS DRAWN

## 2015-08-03 ENCOUNTER — Encounter: Payer: Self-pay | Admitting: Family Medicine

## 2015-08-03 ENCOUNTER — Ambulatory Visit (INDEPENDENT_AMBULATORY_CARE_PROVIDER_SITE_OTHER): Payer: 59 | Admitting: Family Medicine

## 2015-08-03 VITALS — BP 100/80 | HR 83 | Temp 98.0°F | Ht 61.75 in | Wt 128.8 lb

## 2015-08-03 DIAGNOSIS — J0100 Acute maxillary sinusitis, unspecified: Secondary | ICD-10-CM

## 2015-08-03 MED ORDER — AMOXICILLIN-POT CLAVULANATE 875-125 MG PO TABS: 1.0000 | ORAL_TABLET | Freq: Two times a day (BID) | ORAL | Status: DC

## 2015-08-03 NOTE — Progress Notes (Signed)
Pre visit review using our clinic review tool, if applicable. No additional management support is needed unless otherwise documented below in the visit note. 

## 2015-08-03 NOTE — Progress Notes (Signed)
   HPI:  Acute visit for:  "sinus infection" -started: about 2 weeks ago then worse the last few days with sinus pain and thick nasal congestion, drainage and mild cough -denies:fever, SOB, NVD, tooth pain -has tried: OTC options -sick contacts/travel/risks: denies flu exposure, tick exposure or or Ebola risks -Hx of: sinusitis  ROS: See pertinent positives and negatives per HPI.  Past Medical History  Diagnosis Date  . Anemia   . Hemochromatosis   . Pregnancy induced hypertension     with first pregnancy  . Ovarian cyst     Past Surgical History  Procedure Laterality Date  . Wisdon teeth extraction    . Cesarean section      x 2  . Cervical biopsy  w/ loop electrode excision      2010    Family History  Problem Relation Age of Onset  . Hypertension Mother   . Hypertension Father   . Hypertension Maternal Grandmother   . Hypothyroidism Maternal Grandmother   . Pancreatic cancer Maternal Grandfather     Social History   Social History  . Marital Status: Married    Spouse Name: N/A  . Number of Children: N/A  . Years of Education: N/A   Social History Main Topics  . Smoking status: Never Smoker   . Smokeless tobacco: None  . Alcohol Use: Yes     Comment: rare  . Drug Use: No  . Sexual Activity: Not Asked   Other Topics Concern  . None   Social History Narrative     Current outpatient prescriptions:  .  norgestimate-ethinyl estradiol (ORTHO-CYCLEN,SPRINTEC,PREVIFEM) 0.25-35 MG-MCG tablet, Take 1 tablet by mouth daily., Disp: , Rfl:  .  amoxicillin-clavulanate (AUGMENTIN) 875-125 MG tablet, Take 1 tablet by mouth 2 (two) times daily., Disp: 20 tablet, Rfl: 0  EXAM:  Filed Vitals:   08/03/15 1556  BP: 100/80  Pulse: 83  Temp: 98 F (36.7 C)    Body mass index is 23.76 kg/(m^2).  GENERAL: vitals reviewed and listed above, alert, oriented, appears well hydrated and in no acute distress  HEENT: atraumatic, conjunttiva clear, no obvious  abnormalities on inspection of external nose and ears, normal appearance of ear canals and TMs, thick nasal congestion, mild post oropharyngeal erythema with PND, no tonsillar edema or exudate, no sinus TTP  NECK: no obvious masses on inspection  LUNGS: clear to auscultation bilaterally, no wheezes, rales or rhonchi, good air movement  CV: HRRR, no peripheral edema  MS: moves all extremities without noticeable abnormality  PSYCH: pleasant and cooperative, no obvious depression or anxiety  ASSESSMENT AND PLAN:  Discussed the following assessment and plan:  Acute maxillary sinusitis, recurrence not specified  -We discussed potential etiologies, withsinusitis being most likely. We discussed treatment side effects, likely course, antibiotic misuse, transmission, and signs of developing a serious illness. -of course, we advised to return or notify a doctor immediately if symptoms worsen or persist or new concerns arise.    There are no Patient Instructions on file for this visit.   Kriste BasqueKIM, Pretty Weltman R.

## 2015-09-19 ENCOUNTER — Ambulatory Visit (INDEPENDENT_AMBULATORY_CARE_PROVIDER_SITE_OTHER): Payer: 59 | Admitting: Adult Health

## 2015-09-19 ENCOUNTER — Encounter: Payer: Self-pay | Admitting: Adult Health

## 2015-09-19 VITALS — BP 120/82 | HR 91 | Temp 98.6°F | Ht 61.0 in | Wt 126.9 lb

## 2015-09-19 DIAGNOSIS — Z0000 Encounter for general adult medical examination without abnormal findings: Secondary | ICD-10-CM | POA: Diagnosis not present

## 2015-09-19 NOTE — Patient Instructions (Addendum)
It was great meeting you today!  I will follow up with ou regarding your blood work. Follow up with me for your next physical in 1-2 years. If you need anything, please let me know   Health Maintenance, Female Adopting a healthy lifestyle and getting preventive care can go a long way to promote health and wellness. Talk with your health care provider about what schedule of regular examinations is right for you. This is a good chance for you to check in with your provider about disease prevention and staying healthy. In between checkups, there are plenty of things you can do on your own. Experts have done a lot of research about which lifestyle changes and preventive measures are most likely to keep you healthy. Ask your health care provider for more information. WEIGHT AND DIET  Eat a healthy diet  Be sure to include plenty of vegetables, fruits, low-fat dairy products, and lean protein.  Do not eat a lot of foods high in solid fats, added sugars, or salt.  Get regular exercise. This is one of the most important things you can do for your health.  Most adults should exercise for at least 150 minutes each week. The exercise should increase your heart rate and make you sweat (moderate-intensity exercise).  Most adults should also do strengthening exercises at least twice a week. This is in addition to the moderate-intensity exercise.  Maintain a healthy weight  Body mass index (BMI) is a measurement that can be used to identify possible weight problems. It estimates body fat based on height and weight. Your health care provider can help determine your BMI and help you achieve or maintain a healthy weight.  For females 1 years of age and older:   A BMI below 18.5 is considered underweight.  A BMI of 18.5 to 24.9 is normal.  A BMI of 25 to 29.9 is considered overweight.  A BMI of 30 and above is considered obese.  Watch levels of cholesterol and blood lipids  You should start  having your blood tested for lipids and cholesterol at 35 years of age, then have this test every 5 years.  You may need to have your cholesterol levels checked more often if:  Your lipid or cholesterol levels are high.  You are older than 35 years of age.  You are at high risk for heart disease.  CANCER SCREENING   Lung Cancer  Lung cancer screening is recommended for adults 84-54 years old who are at high risk for lung cancer because of a history of smoking.  A yearly low-dose CT scan of the lungs is recommended for people who:  Currently smoke.  Have quit within the past 15 years.  Have at least a 30-pack-year history of smoking. A pack year is smoking an average of one pack of cigarettes a day for 1 year.  Yearly screening should continue until it has been 15 years since you quit.  Yearly screening should stop if you develop a health problem that would prevent you from having lung cancer treatment.  Breast Cancer  Practice breast self-awareness. This means understanding how your breasts normally appear and feel.  It also means doing regular breast self-exams. Let your health care provider know about any changes, no matter how small.  If you are in your 20s or 30s, you should have a clinical breast exam (CBE) by a health care provider every 1-3 years as part of a regular health exam.  If you are 40  or older, have a CBE every year. Also consider having a breast X-ray (mammogram) every year.  If you have a family history of breast cancer, talk to your health care provider about genetic screening.  If you are at high risk for breast cancer, talk to your health care provider about having an MRI and a mammogram every year.  Breast cancer gene (BRCA) assessment is recommended for women who have family members with BRCA-related cancers. BRCA-related cancers include:  Breast.  Ovarian.  Tubal.  Peritoneal cancers.  Results of the assessment will determine the need for  genetic counseling and BRCA1 and BRCA2 testing. Cervical Cancer Your health care provider may recommend that you be screened regularly for cancer of the pelvic organs (ovaries, uterus, and vagina). This screening involves a pelvic examination, including checking for microscopic changes to the surface of your cervix (Pap test). You may be encouraged to have this screening done every 3 years, beginning at age 7.  For women ages 73-65, health care providers may recommend pelvic exams and Pap testing every 3 years, or they may recommend the Pap and pelvic exam, combined with testing for human papilloma virus (HPV), every 5 years. Some types of HPV increase your risk of cervical cancer. Testing for HPV may also be done on women of any age with unclear Pap test results.  Other health care providers may not recommend any screening for nonpregnant women who are considered low risk for pelvic cancer and who do not have symptoms. Ask your health care provider if a screening pelvic exam is right for you.  If you have had past treatment for cervical cancer or a condition that could lead to cancer, you need Pap tests and screening for cancer for at least 20 years after your treatment. If Pap tests have been discontinued, your risk factors (such as having a new sexual partner) need to be reassessed to determine if screening should resume. Some women have medical problems that increase the chance of getting cervical cancer. In these cases, your health care provider may recommend more frequent screening and Pap tests. Colorectal Cancer  This type of cancer can be detected and often prevented.  Routine colorectal cancer screening usually begins at 35 years of age and continues through 35 years of age.  Your health care provider may recommend screening at an earlier age if you have risk factors for colon cancer.  Your health care provider may also recommend using home test kits to check for hidden blood in the  stool.  A small camera at the end of a tube can be used to examine your colon directly (sigmoidoscopy or colonoscopy). This is done to check for the earliest forms of colorectal cancer.  Routine screening usually begins at age 91.  Direct examination of the colon should be repeated every 5-10 years through 35 years of age. However, you may need to be screened more often if early forms of precancerous polyps or small growths are found. Skin Cancer  Check your skin from head to toe regularly.  Tell your health care provider about any new moles or changes in moles, especially if there is a change in a mole's shape or color.  Also tell your health care provider if you have a mole that is larger than the size of a pencil eraser.  Always use sunscreen. Apply sunscreen liberally and repeatedly throughout the day.  Protect yourself by wearing long sleeves, pants, a wide-brimmed hat, and sunglasses whenever you are outside. HEART DISEASE,  DIABETES, AND HIGH BLOOD PRESSURE   High blood pressure causes heart disease and increases the risk of stroke. High blood pressure is more likely to develop in:  People who have blood pressure in the high end of the normal range (130-139/85-89 mm Hg).  People who are overweight or obese.  People who are African American.  If you are 18-39 years of age, have your blood pressure checked every 3-5 years. If you are 40 years of age or older, have your blood pressure checked every year. You should have your blood pressure measured twice--once when you are at a hospital or clinic, and once when you are not at a hospital or clinic. Record the average of the two measurements. To check your blood pressure when you are not at a hospital or clinic, you can use:  An automated blood pressure machine at a pharmacy.  A home blood pressure monitor.  If you are between 55 years and 79 years old, ask your health care provider if you should take aspirin to prevent  strokes.  Have regular diabetes screenings. This involves taking a blood sample to check your fasting blood sugar level.  If you are at a normal weight and have a low risk for diabetes, have this test once every three years after 35 years of age.  If you are overweight and have a high risk for diabetes, consider being tested at a younger age or more often. PREVENTING INFECTION  Hepatitis B  If you have a higher risk for hepatitis B, you should be screened for this virus. You are considered at high risk for hepatitis B if:  You were born in a country where hepatitis B is common. Ask your health care provider which countries are considered high risk.  Your parents were born in a high-risk country, and you have not been immunized against hepatitis B (hepatitis B vaccine).  You have HIV or AIDS.  You use needles to inject street drugs.  You live with someone who has hepatitis B.  You have had sex with someone who has hepatitis B.  You get hemodialysis treatment.  You take certain medicines for conditions, including cancer, organ transplantation, and autoimmune conditions. Hepatitis C  Blood testing is recommended for:  Everyone born from 1945 through 1965.  Anyone with known risk factors for hepatitis C. Sexually transmitted infections (STIs)  You should be screened for sexually transmitted infections (STIs) including gonorrhea and chlamydia if:  You are sexually active and are younger than 35 years of age.  You are older than 35 years of age and your health care provider tells you that you are at risk for this type of infection.  Your sexual activity has changed since you were last screened and you are at an increased risk for chlamydia or gonorrhea. Ask your health care provider if you are at risk.  If you do not have HIV, but are at risk, it may be recommended that you take a prescription medicine daily to prevent HIV infection. This is called pre-exposure prophylaxis  (PrEP). You are considered at risk if:  You are sexually active and do not regularly use condoms or know the HIV status of your partner(s).  You take drugs by injection.  You are sexually active with a partner who has HIV. Talk with your health care provider about whether you are at high risk of being infected with HIV. If you choose to begin PrEP, you should first be tested for HIV. You should then   be tested every 3 months for as long as you are taking PrEP.  PREGNANCY   If you are premenopausal and you may become pregnant, ask your health care provider about preconception counseling.  If you may become pregnant, take 400 to 800 micrograms (mcg) of folic acid every day.  If you want to prevent pregnancy, talk to your health care provider about birth control (contraception). OSTEOPOROSIS AND MENOPAUSE   Osteoporosis is a disease in which the bones lose minerals and strength with aging. This can result in serious bone fractures. Your risk for osteoporosis can be identified using a bone density scan.  If you are 70 years of age or older, or if you are at risk for osteoporosis and fractures, ask your health care provider if you should be screened.  Ask your health care provider whether you should take a calcium or vitamin D supplement to lower your risk for osteoporosis.  Menopause may have certain physical symptoms and risks.  Hormone replacement therapy may reduce some of these symptoms and risks. Talk to your health care provider about whether hormone replacement therapy is right for you.  HOME CARE INSTRUCTIONS   Schedule regular health, dental, and eye exams.  Stay current with your immunizations.   Do not use any tobacco products including cigarettes, chewing tobacco, or electronic cigarettes.  If you are pregnant, do not drink alcohol.  If you are breastfeeding, limit how much and how often you drink alcohol.  Limit alcohol intake to no more than 1 drink per day for  nonpregnant women. One drink equals 12 ounces of beer, 5 ounces of wine, or 1 ounces of hard liquor.  Do not use street drugs.  Do not share needles.  Ask your health care provider for help if you need support or information about quitting drugs.  Tell your health care provider if you often feel depressed.  Tell your health care provider if you have ever been abused or do not feel safe at home.   This information is not intended to replace advice given to you by your health care provider. Make sure you discuss any questions you have with your health care provider.   Document Released: 02/03/2011 Document Revised: 08/11/2014 Document Reviewed: 06/22/2013 Elsevier Interactive Patient Education [employer].

## 2015-09-19 NOTE — Progress Notes (Signed)
Patient presents to clinic today to establish care. She is a very pleasant, active and healthy caucasian female  has a past medical history of Anemia; Hemochromatosis; Pregnancy induced hypertension; and Ovarian cyst.   Acute Concerns:  Annual Exam   Chronic Issues: Hemochromatosis- Managed by [facility] every 12 months.   Health Maintenance: Dental -- Twice a year - Dr. Marijo [facility] -- Once a year- [facility] --UTD PAP -- December - GYN     Past Medical History  Diagnosis Date  . Anemia   . Hemochromatosis   . Pregnancy induced hypertension     with first pregnancy  . Ovarian cyst     Past Surgical History  Procedure Laterality Date  . Wisdon teeth extraction    . Cesarean section      x 2  . Cervical biopsy  w/ loop electrode excision      2010    Current Outpatient Prescriptions on File Prior to Visit  Medication Sig Dispense Refill  . norgestimate-ethinyl estradiol (ORTHO-CYCLEN,SPRINTEC,PREVIFEM) 0.25-35 MG-MCG tablet Take 1 tablet by mouth daily.     No current facility-administered medications on file prior to visit.    No Known Allergies  Family History  Problem Relation Age of Onset  . Hypertension Mother   . Hypertension Father   . Hypertension Maternal Grandmother   . Hypothyroidism Maternal Grandmother   . Pancreatic cancer Maternal Grandfather     Social History   Social History  . Marital Status: Married    Spouse Name: N/A  . Number of Children: N/A  . Years of Education: N/A   Occupational History  . Not on file.   Social History Main Topics  . Smoking status: Never Smoker   . Smokeless tobacco: Not on file  . Alcohol Use: Yes     Comment: rare  . Drug Use: No  . Sexual Activity: Not on file   Other Topics Concern  . Not on file   Social History Narrative    Review of Systems  Constitutional: Negative.   HENT: Negative.   Eyes: Negative.   Respiratory: Negative.   Cardiovascular: Negative.     Gastrointestinal: Negative.   Genitourinary: Negative.   Musculoskeletal: Negative.   Skin: Negative.   Neurological: Negative.   Endo/Heme/Allergies: Negative.   Psychiatric/Behavioral: Negative.   All other systems reviewed and are negative.   BP 120/82 mmHg  Pulse 91  Temp(Src) 98.6 F (37 C) (Oral)  Ht  (1.549 m)  Wt 126 lb 14.4 oz (57.561 kg)  BMI 23.99 kg/m2  Physical Exam  Constitutional: She is oriented to person, place, and time and well-developed, well-nourished, and in no distress. No distress.  HENT:  Head: Normocephalic and atraumatic.  Right Ear: External ear normal.  Left Ear: External ear normal.  Nose: Nose normal.  Mouth/Throat: Oropharynx is clear and moist. No oropharyngeal exudate.  Small tonsil stone  Eyes: Conjunctivae and EOM are normal. Pupils are equal, round, and reactive to light. Right eye exhibits no discharge. Left eye exhibits no discharge. No scleral icterus.  Neck: Normal range of motion. Neck supple. No JVD present. No tracheal deviation present. No thyromegaly present.  Cardiovascular: Normal rate, regular rhythm, normal heart sounds and intact distal pulses.  Exam reveals no gallop and no friction rub.   No murmur heard. Pulmonary/Chest: Effort normal and breath sounds normal. No stridor. No respiratory distress. She has no wheezes. She has no rales. She exhibits no tenderness.  Abdominal: Soft. Bowel  sounds are normal. She exhibits no distension and no mass. There is no tenderness. There is no rebound and no guarding.  Musculoskeletal: Normal range of motion. She exhibits no edema or tenderness.  Lymphadenopathy:    She has no cervical adenopathy.  Neurological: She is alert and oriented to person, place, and time. She has normal reflexes. She displays normal reflexes. No cranial nerve deficit. She exhibits normal muscle tone. Gait normal. Coordination normal. GCS score is 15.  Skin: Skin is warm and dry. No rash noted. She is not  diaphoretic. No erythema. No pallor.  Psychiatric: Mood, memory, affect and judgment normal.  Nursing note reviewed.   Assessment/Plan:   1. Routine general medical examination at a health care facility - Basic metabolic panel; Future - CBC with Differential/Platelet; Future - Hepatic function panel; Future - Lipid panel; Future - POCT urinalysis dipstick; Future - TSH; Future - Follow up in one year or sooner if needed 2. Hemochromatosis - Followed by [facility]

## 2015-09-19 NOTE — Progress Notes (Signed)
Pre visit review using our clinic review tool, if applicable. No additional management support is needed unless otherwise documented below in the visit note. 

## 2015-11-09 ENCOUNTER — Other Ambulatory Visit (INDEPENDENT_AMBULATORY_CARE_PROVIDER_SITE_OTHER): Payer: 59

## 2015-11-09 DIAGNOSIS — Z0000 Encounter for general adult medical examination without abnormal findings: Secondary | ICD-10-CM | POA: Diagnosis not present

## 2015-11-09 LAB — BASIC METABOLIC PANEL
BUN: 10 mg/dL (ref 6–23)
CALCIUM: 9.2 mg/dL (ref 8.4–10.5)
CHLORIDE: 103 meq/L (ref 96–112)
CO2: 29 meq/L (ref 19–32)
CREATININE: 0.75 mg/dL (ref 0.40–1.20)
GFR: 93.8 mL/min (ref >60.00)
GLUCOSE: 85 mg/dL (ref 70–99)
Potassium: 4.2 mEq/L (ref 3.5–5.1)
Sodium: 138 mEq/L (ref 135–145)

## 2015-11-09 LAB — HEPATIC FUNCTION PANEL
ALBUMIN: 4 g/dL (ref 3.5–5.2)
ALT: 11 U/L (ref 0–35)
AST: 12 U/L (ref 0–37)
Alkaline Phosphatase: 45 U/L (ref 39–117)
BILIRUBIN DIRECT: 0.1 mg/dL (ref 0.0–0.3)
TOTAL PROTEIN: 7 g/dL (ref 6.0–8.3)
Total Bilirubin: 0.7 mg/dL (ref 0.2–1.2)

## 2015-11-09 LAB — CBC WITH DIFFERENTIAL/PLATELET
BASOS PCT: 0.7 % (ref 0.0–3.0)
Basophils Absolute: 0 10*3/uL (ref 0.0–0.1)
Eosinophils Absolute: 0.1 10*3/uL (ref 0.0–0.7)
Eosinophils Relative: 2.1 % (ref 0.0–5.0)
HEMATOCRIT: 37.2 % (ref 36.0–46.0)
Hemoglobin: 12.7 g/dL (ref 12.0–15.0)
LYMPHS ABS: 1.7 10*3/uL (ref 0.7–4.0)
LYMPHS PCT: 47.6 % — AB (ref 12.0–46.0)
MCHC: 34.3 g/dL (ref 30.0–36.0)
MCV: 88.4 fl (ref 78.0–100.0)
Monocytes Absolute: 0.3 10*3/uL (ref 0.1–1.0)
Monocytes Relative: 8 % (ref 3.0–12.0)
NEUTROS ABS: 1.5 10*3/uL (ref 1.4–7.7)
NEUTROS PCT: 41.6 % — AB (ref 43.0–77.0)
PLATELETS: 269 10*3/uL (ref 150.0–400.0)
RBC: 4.2 Mil/uL (ref 3.87–5.11)
RDW: 13.3 % (ref 11.5–15.5)
WBC: 3.6 10*3/uL — ABNORMAL LOW (ref 4.0–10.5)

## 2015-11-09 LAB — LIPID PANEL
CHOL/HDL RATIO: 2
Cholesterol: 174 mg/dL (ref 0–200)
HDL: 83.8 mg/dL (ref >39.00)
LDL Cholesterol: 71 mg/dL (ref 0–99)
NonHDL: 90.3
TRIGLYCERIDES: 98 mg/dL (ref 0.0–149.0)
VLDL: 19.6 mg/dL (ref 0.0–40.0)

## 2015-11-09 LAB — POCT URINALYSIS DIPSTICK
Bilirubin, UA: NEGATIVE
GLUCOSE UA: NEGATIVE
Ketones, UA: NEGATIVE
Leukocytes, UA: NEGATIVE
NITRITE UA: NEGATIVE
Protein, UA: NEGATIVE
Spec Grav, UA: 1.02
UROBILINOGEN UA: 1
pH, UA: 6.5

## 2015-11-09 LAB — TSH: TSH: 1.8 u[IU]/mL (ref 0.35–4.50)

## 2015-11-19 ENCOUNTER — Telehealth: Payer: Self-pay | Admitting: Adult Health

## 2015-11-19 NOTE — Telephone Encounter (Signed)
Pt would like to have results from 4/7 lab work.  Pls call the work # 1st and ask for [employer].

## 2015-11-20 NOTE — Telephone Encounter (Signed)
Patient notified of these results. Patient verbalized understanding.  

## 2016-06-10 ENCOUNTER — Encounter (HOSPITAL_COMMUNITY): Payer: 59 | Attending: Hematology & Oncology

## 2016-06-10 ENCOUNTER — Encounter (HOSPITAL_BASED_OUTPATIENT_CLINIC_OR_DEPARTMENT_OTHER): Payer: 59 | Admitting: Oncology

## 2016-06-10 ENCOUNTER — Ambulatory Visit (HOSPITAL_COMMUNITY): Payer: 59 | Admitting: Oncology

## 2016-06-10 ENCOUNTER — Encounter (HOSPITAL_COMMUNITY): Payer: Self-pay | Admitting: Oncology

## 2016-06-10 LAB — CBC WITH DIFFERENTIAL/PLATELET
Basophils Absolute: 0 10*3/uL (ref 0.0–0.1)
Basophils Relative: 0 %
EOS ABS: 0.1 10*3/uL (ref 0.0–0.7)
EOS PCT: 2 %
HCT: 39.7 % (ref 36.0–46.0)
HEMOGLOBIN: 13.6 g/dL (ref 12.0–15.0)
LYMPHS ABS: 2 10*3/uL (ref 0.7–4.0)
LYMPHS PCT: 56 %
MCH: 30.8 pg (ref 26.0–34.0)
MCHC: 34.3 g/dL (ref 30.0–36.0)
MCV: 89.8 fL (ref 78.0–100.0)
MONOS PCT: 8 %
Monocytes Absolute: 0.3 10*3/uL (ref 0.1–1.0)
NEUTROS PCT: 34 %
Neutro Abs: 1.3 10*3/uL — ABNORMAL LOW (ref 1.7–7.7)
Platelets: 242 10*3/uL (ref 150–400)
RBC: 4.42 MIL/uL (ref 3.87–5.11)
RDW: 13 % (ref 11.5–15.5)
WBC: 3.7 10*3/uL — ABNORMAL LOW (ref 4.0–10.5)

## 2016-06-10 LAB — FERRITIN: FERRITIN: 6 ng/mL — AB (ref 11–307)

## 2016-06-10 NOTE — Assessment & Plan Note (Addendum)
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    She has not required phlebotomies as ferritin has been below 50 secondary to menstruation.  Once post-menopausal, she may require regular phlebotomies to maintain ferritin at 50 or less.  Labs today: CBC diff, ferritin.  I personally reviewed and went over laboratory results with the patient.  The results are noted within this dictation.  Ferritin is pending at the time of this dictation.  Labs in 12 months: CBC diff, ferritin  Return in 12 months for follow-up, sooner if needed.

## 2016-06-10 NOTE — Progress Notes (Signed)
Shirline Freesory Nafziger, NP [address] [place] [address]  [name]: CBC with Differential, Ferritin  CURRENT THERAPY: Observation  INTERVAL HISTORY: Elizabeth PolesDeborah A Carr 35 y.o. 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   She is doing very well.  She had a nice year without any complaints.  She is still menstruating.  She denies any new chronic joint pains, skin changes, lethargy.  Review of Systems  Constitutional: Negative.  Negative for chills, fever, malaise/fatigue and weight loss.  HENT: Negative.   Eyes: Negative.  Negative for double vision.  Respiratory: Negative.  Negative for cough, hemoptysis, sputum production and shortness of breath.   Cardiovascular: Negative.  Negative for chest pain, palpitations and leg swelling.  Gastrointestinal: Negative.  Negative for abdominal pain, constipation, diarrhea, nausea and vomiting.  Genitourinary: Negative.   Musculoskeletal: Negative.  Negative for falls, joint pain and myalgias.  Skin: Negative.  Negative for itching and rash.  Neurological: Negative.  Negative for weakness.  Endo/Heme/Allergies: Negative.   Psychiatric/Behavioral: Negative.     Past Medical History:  Diagnosis Date  . Anemia   . Hemochromatosis   . Ovarian cyst   . Pregnancy induced hypertension    with first pregnancy    Past Surgical History:  Procedure Laterality Date  . CERVICAL BIOPSY  W/ LOOP ELECTRODE EXCISION     2010  . CESAREAN SECTION     x 2  . wisdon teeth extraction      Family History  Problem Relation Age of Onset  . Hypertension Mother   . Hemochromatosis Mother   . Hypertension Father   . Hypertension Maternal Grandmother   . Hypothyroidism Maternal Grandmother   . Pancreatic cancer Maternal Grandfather   .  Hemochromatosis Brother     Social History   Social History  . Marital status: Married    Spouse name: N/A  . Number of children: N/A  . Years of education: N/A   Social History Main Topics  . Smoking status: Never Smoker  . Smokeless tobacco: Never Used  . Alcohol use Yes     Comment: rare  . Drug use: No  . Sexual activity: Not Asked   Other Topics Concern  . 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[employer]   Married    Two children    Exercises, likes to go to the beach     PHYSICAL EXAMINATION  ECOG PERFORMANCE STATUS: 0 - Asymptomatic  Vitals:   06/10/16 1037  BP: 127/72  Pulse: 62  Resp: 16  Temp: 98.2 F (36.8 C)    GENERAL:alert, healthy, no distress, well nourished, well developed, comfortable, cooperative, smiling and unaccompanied SKIN: skin color, texture, turgor are normal, no rashes or significant lesions HEAD: Normocephalic, No masses, lesions, tenderness or abnormalities EYES: normal, EOMI, Conjunctiva are pink and non-injected EARS: External ears normal OROPHARYNX:lips, buccal mucosa, and tongue normal and mucous membranes are moist  NECK: supple, no adenopathy, thyroid normal size, non-tender, without nodularity, trachea midline LYMPH:  no palpable lymphadenopathy, no hepatosplenomegaly BREAST:not examined LUNGS: clear to auscultation and percussion HEART: regular rate & rhythm, no murmurs, no gallops, S1 normal and S2 normal ABDOMEN:abdomen soft, non-tender, normal bowel sounds and no masses or organomegaly BACK: Back symmetric, no curvature., Range of motion  is normal EXTREMITIES:less then 2 second capillary refill, no joint deformities, effusion, or inflammation, no edema, no skin discoloration, no clubbing, no cyanosis  NEURO: alert & oriented x 3 with fluent speech, no focal motor/sensory deficits, gait normal   LABORATORY DATA: CBC    Component Value Date/Time   WBC 3.7 (L) 06/10/2016 0912   RBC 4.42 06/10/2016 0912     HGB 13.6 06/10/2016 0912   HCT 39.7 06/10/2016 0912   PLT 242 06/10/2016 0912   MCV 89.8 06/10/2016 0912   MCH 30.8 06/10/2016 0912   MCHC 34.3 06/10/2016 0912   RDW 13.0 06/10/2016 0912   LYMPHSABS 2.0 06/10/2016 0912   MONOABS 0.3 06/10/2016 0912   EOSABS 0.1 06/10/2016 0912   BASOSABS 0.0 06/10/2016 0912      Chemistry      Component Value Date/Time   NA 138 11/09/2015 0853   K 4.2 11/09/2015 0853   CL 103 11/09/2015 0853   CO2 29 11/09/2015 0853   BUN 10 11/09/2015 0853   CREATININE 0.75 11/09/2015 0853      Component Value Date/Time   CALCIUM 9.2 11/09/2015 0853   ALKPHOS 45 11/09/2015 0853   AST 12 11/09/2015 0853   ALT 11 11/09/2015 0853   BILITOT 0.7 11/09/2015 0853     Lab Results  Component Value Date   FERRITIN 6 (L) 06/07/2015     PENDING LABS:   RADIOGRAPHIC STUDIES:  No results found.   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    She has not required phlebotomies as ferritin has been below 50 secondary to menstruation.  Once post-menopausal, she may require regular phlebotomies to maintain ferritin at 50 or less.  Labs today: CBC diff, ferritin.  I personally reviewed and went over laboratory results with the patient.  The results are noted within this dictation.  Ferritin is pending at the time of this dictation.  Labs in 12 months: CBC diff, ferritin  Return in 12 months for follow-up, sooner if needed.   ORDERS PLACED FOR THIS ENCOUNTER: Orders Placed This Encounter  Procedures  . CBC with Differential  . Ferritin    MEDICATIONS PRESCRIBED THIS ENCOUNTER: Meds ordered this encounter  Medications  . DISCONTD: norgestimate-ethinyl estradiol (ORTHO-CYCLEN,SPRINTEC,PREVIFEM) 0.25-35 MG-MCG tablet    Sig: Take by mouth.    THERAPY PLAN:  Continue observation as above.  Goal is to maintain a ferritin  of 50 or less with therapeutic phlebotomy when needed.  All questions were answered. The patient knows to call the clinic with any problems, questions or concerns. We can certainly see the patient much sooner if necessary.  Patient and plan discussed with Dr. Loma MessingShannon Penland and she is in agreement with the aforementioned.   This note is electronically signed by: Tina GriffithsKEFALAS,Devlynn Knoff, PA-C 06/10/2016 11:58 AM

## 2016-06-10 NOTE — Patient Instructions (Addendum)
[facility] at [facility] Discharge Instructions  RECOMMENDATIONS MADE BY THE CONSULTANT AND ANY TEST RESULTS WILL BE SENT TO YOUR REFERRING PHYSICIAN.  You were seen today by Jenita Seashoreom Kefalas PA-C. Labs today are stable.  Your FERRITIN level is pending at this time. We will recheck labs in 12 months and see you back in 12 months. Please call in the interim with any questions or concerns.  CBC    Component Value Date/Time   WBC 3.7 (L) 06/10/2016 0912   RBC 4.42 06/10/2016 0912   HGB 13.6 06/10/2016 0912   HCT 39.7 06/10/2016 0912   PLT 242 06/10/2016 0912   MCV 89.8 06/10/2016 0912   MCH 30.8 06/10/2016 0912   MCHC 34.3 06/10/2016 0912   RDW 13.0 06/10/2016 0912   LYMPHSABS 2.0 06/10/2016 0912   MONOABS 0.3 06/10/2016 0912   EOSABS 0.1 06/10/2016 0912   BASOSABS 0.0 06/10/2016 0912     Thank you for choosing [facility] at [facility] to provide your oncology and hematology care.  To afford each patient quality time with our provider, please arrive at least 15 minutes before your scheduled appointment time.   Beginning January 23rd 2017 lab [facility] for [facility] will be done in the  Main lab at [facility] on 1st floor. If you have a lab appointment with the Cancer Center please come in thru the  Main Entrance and check in at the main information desk  You need to re-schedule your appointment should you arrive 10 or more minutes late.  We strive to give you quality time with our providers, and arriving late affects you and other patients whose appointments are after yours.  Also, if you no show three or more times for appointments you may be dismissed from the clinic at the providers discretion.     Again, thank you for choosing [facility].  Our hope is that these requests will decrease the amount of time that you wait before being seen by our physicians.        _____________________________________________________________  Should you have questions after your visit to [facility], please contact our office at [phone] between the hours of 8:30 a.m. and 4:30 p.m.  Voicemails left after 4:30 p.m. will not be returned until the following business day.  For prescription refill requests, have your pharmacy contact our office.         Resources For Cancer Patients and their Caregivers ? American Cancer Society: Can assist with transportation, wigs, general needs, runs Look Good Feel Better.        [phone] ? Cancer Care: Provides financial assistance, online support groups, medication/co-pay assistance.  1-800-813-HOPE 825-628-6521(4673) ? [facility] Assists [place] cancer patients and their families through emotional , educational and financial support.  332-309-8747816-612-8879 ? [place] DSS Where to apply for food stamps, Medicaid and utility assistance. 470-495-7074(307)820-5383 ? [facility]: Transportation to medical appointments. 408-505-4105305 637 9712 ? Social Security Administration: May apply for disability if have a Stage IV cancer. [phone] ? [place] A[facility], Disability and Transit Services: Assists with nutrition, care and transit needs. 779-499-9675848-169-0534  Cancer Center Support Programs: @10RELATIVEDAYS @ > Cancer Support Group  2nd Tuesday of the month 1pm-2pm, Journey Room  > Creative Journey  3rd Tuesday of the month 1130am-1pm, Journey Room  > Look Good Feel Better  1st Wednesday of the month 10am-12 noon, Journey Room (Call American Cancer Society to register  1-800-395-5775)    

## 2016-10-08 DIAGNOSIS — Z6823 Body mass index (BMI) 23.0-23.9, adult: Secondary | ICD-10-CM | POA: Diagnosis not present

## 2016-10-08 DIAGNOSIS — Z01419 Encounter for gynecological examination (general) (routine) without abnormal findings: Secondary | ICD-10-CM | POA: Diagnosis not present

## 2017-04-28 DIAGNOSIS — Z23 Encounter for immunization: Secondary | ICD-10-CM | POA: Diagnosis not present

## 2017-06-08 ENCOUNTER — Ambulatory Visit: Payer: Self-pay | Admitting: Allergy & Immunology

## 2017-06-10 ENCOUNTER — Encounter (HOSPITAL_COMMUNITY): Payer: Self-pay | Admitting: Oncology

## 2017-06-10 ENCOUNTER — Encounter (HOSPITAL_COMMUNITY): Payer: 59 | Attending: Oncology

## 2017-06-10 ENCOUNTER — Encounter (HOSPITAL_BASED_OUTPATIENT_CLINIC_OR_DEPARTMENT_OTHER): Payer: 59 | Admitting: Oncology

## 2017-06-10 LAB — CBC WITH DIFFERENTIAL/PLATELET
BASOS ABS: 0 10*3/uL (ref 0.0–0.1)
Basophils Relative: 0 %
Eosinophils Absolute: 0.1 10*3/uL (ref 0.0–0.7)
Eosinophils Relative: 1 %
HEMATOCRIT: 37.6 % (ref 36.0–46.0)
HEMOGLOBIN: 12.9 g/dL (ref 12.0–15.0)
Lymphocytes Relative: 49 %
Lymphs Abs: 2.5 10*3/uL (ref 0.7–4.0)
MCH: 30.8 pg (ref 26.0–34.0)
MCHC: 34.3 g/dL (ref 30.0–36.0)
MCV: 89.7 fL (ref 78.0–100.0)
Monocytes Absolute: 0.4 10*3/uL (ref 0.1–1.0)
Monocytes Relative: 8 %
NEUTROS ABS: 2.2 10*3/uL (ref 1.7–7.7)
NEUTROS PCT: 42 %
Platelets: 220 10*3/uL (ref 150–400)
RBC: 4.19 MIL/uL (ref 3.87–5.11)
RDW: 12.8 % (ref 11.5–15.5)
WBC: 5.1 10*3/uL (ref 4.0–10.5)

## 2017-06-10 LAB — FERRITIN: Ferritin: 8 ng/mL — ABNORMAL LOW (ref 11–307)

## 2017-06-10 NOTE — Progress Notes (Signed)
Shirline FreesNafziger, Cory, NP [address] [place] [address]  No diagnosis found.  CURRENT THERAPY: Observation  INTERVAL HISTORY: Elizabeth PolesDeborah A Lucken 36 y.o. 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   She presents with her husband today. She states she has been doing well and has no complaints. She denies any new health issues over the past year. She continues to have regular monthly menstrual cycles that lasts about 5 days.   Review of Systems  Constitutional: Negative.  Negative for chills, fever, malaise/fatigue and weight loss.  HENT: Negative.   Eyes: Negative.  Negative for double vision.  Respiratory: Negative.  Negative for cough, hemoptysis, sputum production and shortness of breath.   Cardiovascular: Negative.  Negative for chest pain, palpitations and leg swelling.  Gastrointestinal: Negative.  Negative for abdominal pain, constipation, diarrhea, nausea and vomiting.  Genitourinary: Negative.   Musculoskeletal: Negative.  Negative for falls, joint pain and myalgias.  Skin: Negative.  Negative for itching and rash.  Neurological: Negative.  Negative for weakness.  Endo/Heme/Allergies: Negative.   Psychiatric/Behavioral: Negative.     Past Medical History:  Diagnosis Date  . Anemia   . Hemochromatosis   . Ovarian cyst   . Pregnancy induced hypertension    with first pregnancy    Past Surgical History:  Procedure Laterality Date  . CERVICAL BIOPSY  W/ LOOP ELECTRODE EXCISION     2010  . CESAREAN SECTION     x 2  . wisdon teeth extraction      Family History  Problem Relation Age of Onset  . Hypertension Mother   . Hemochromatosis Mother   . Hypertension Father   . Hypertension Maternal Grandmother   . Hypothyroidism Maternal Grandmother   . Pancreatic cancer Maternal Grandfather     . Hemochromatosis Brother     Social History   Socioeconomic History  . Marital status: Married    Spouse name: None  . Number of children: None  . Years of education: None  . Highest education level: None  Social Needs  . Financial resource strain: None  . Food insecurity - worry: None  . Food insecurity - inability: None  . Transportation needs - medical: None  . Transportation needs - non-medical: None  Occupational History  . None  Tobacco Use  . Smoking status: Never Smoker  . Smokeless tobacco: Never Used  Substance and Sexual Activity  . Alcohol use: Yes    Comment: rare  . Drug use: No  . Sexual activity: None  Other Topics Concern  . 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   HENT:  Head: Normocephalic and atraumatic.  Mouth/Throat: No oropharyngeal exudate. Mucosa moist. Eyes: Pupils are equal, round, and reactive to light. Conjunctivae are normal. No scleral icterus.  Neck: Normal range of motion. Neck supple. No JVD present.  Cardiovascular: Normal rate, regular rhythm and normal heart sounds.  Exam reveals no gallop and no friction rub.   No murmur heard. Pulmonary/Chest: Effort normal and breath sounds normal. No respiratory distress. No wheezes.No rales.  Abdominal: Soft. Bowel sounds are normal. No distension. There is no tenderness. There is no guarding.  Musculoskeletal: No edema or tenderness.  Lymphadenopathy:    No cervical or supraclavicular adenopathy.  Neurological: Alert and oriented to person, place, and time. No cranial nerve deficit.  Skin: Skin is warm and dry. No rash noted. No erythema. No pallor.   Psychiatric: Affect and judgment normal.     LABORATORY DATA: CBC    Component Value Date/Time   WBC 5.1 06/10/2017 1321   RBC 4.19 06/10/2017 1321   HGB 12.9 06/10/2017 1321   HCT 37.6 06/10/2017 1321   PLT 220 06/10/2017 1321   MCV 89.7 06/10/2017 1321   MCH 30.8 06/10/2017 1321   MCHC 34.3 06/10/2017 1321   RDW 12.8 06/10/2017 1321   LYMPHSABS 2.5 06/10/2017 1321   MONOABS 0.4 06/10/2017 1321   EOSABS 0.1 06/10/2017 1321   BASOSABS 0.0 06/10/2017 1321      Chemistry      Component Value Date/Time   NA 138 11/09/2015 0853   K 4.2 11/09/2015 0853   CL 103 11/09/2015 0853   CO2 29 11/09/2015 0853   BUN 10 11/09/2015 0853   CREATININE 0.75 11/09/2015 0853      Component Value Date/Time   CALCIUM 9.2 11/09/2015 0853   ALKPHOS 45 11/09/2015 0853   AST 12 11/09/2015 0853   ALT 11 11/09/2015 0853   BILITOT 0.7 11/09/2015 0853     Lab Results  Component Value Date   FERRITIN 6 (L) 06/10/2016    ASSESSMENT:  Hereditary hemochromatosis  PLAN: Hemoglobin 12. 9 g/dL today. Ferritin pending, we will call her with the results. She has not required phlebotomies since ferritin has remained below 50 due to blood loss from monthly menstruation.  Once post-menopausal, she may require regular phlebotomies to maintain ferritin at 50 or less.  RTC in 1 year for follow up with labs. Orders Placed This Encounter  Procedures  . CBC with Differential    Standing Status:   Future    Standing Expiration Date:   12/09/2018  . Ferritin    Standing Status:   Future    Standing Expiration Date:   12/09/2018  . Iron and TIBC    Standing Status:   Future    Standing Expiration Date:   12/09/2018      THERAPY PLAN:  Continue observation as above.  Goal is to maintain a ferritin of 50 or less with therapeutic phlebotomy when needed.  All questions were answered. The patient knows to call the clinic with any problems, questions or concerns. We can certainly see the patient much  sooner if necessary.   This note is electronically signed by: Ralene CorkLouise Derrill Bagnell, MD 06/10/2017 2:01 PM

## 2017-06-10 NOTE — Patient Instructions (Signed)
[facility] at [facility]  Discharge Instructions:  You were seen by Dr. Zhou today. _______________________________________________________________  Thank you for choosing [facility] at [facility] to provide your oncology and hematology care.  To afford each patient quality time with our providers, please arrive at least 15 minutes before your scheduled appointment.  You need to re-schedule your appointment if you arrive 10 or more minutes late.  We strive to give you quality time with our providers, and arriving late affects you and other patients whose appointments are after yours.  Also, if you no show three or more times for appointments you may be dismissed from the clinic.  Again, thank you for choosing [facility] at [facility]. Our hope is that these requests will allow you access to exceptional care and in a timely manner. _______________________________________________________________  If you have questions after your visit, please contact our office at [phone] between the hours of 8:30 a.m. and 5:00 p.m. Voicemails left after 4:30 p.m. will not be returned until the following business day. _______________________________________________________________  For prescription refill requests, have your pharmacy contact our office. _______________________________________________________________  Recommendations made by the consultant and any test results will be sent to your referring physician. _______________________________________________________________ 

## 2017-06-11 ENCOUNTER — Other Ambulatory Visit (HOSPITAL_COMMUNITY): Payer: 59

## 2017-06-11 ENCOUNTER — Other Ambulatory Visit (HOSPITAL_COMMUNITY): Payer: Self-pay | Admitting: Adult Health

## 2017-06-11 ENCOUNTER — Ambulatory Visit (HOSPITAL_COMMUNITY): Payer: 59

## 2017-06-11 DIAGNOSIS — D509 Iron deficiency anemia, unspecified: Secondary | ICD-10-CM | POA: Insufficient documentation

## 2017-07-07 ENCOUNTER — Ambulatory Visit (INDEPENDENT_AMBULATORY_CARE_PROVIDER_SITE_OTHER): Payer: 59 | Admitting: Adult Health

## 2017-07-07 ENCOUNTER — Encounter: Payer: Self-pay | Admitting: Adult Health

## 2017-07-07 VITALS — BP 160/90 | Temp 98.3°F | Ht 61.5 in | Wt 130.0 lb

## 2017-07-07 DIAGNOSIS — Z0000 Encounter for general adult medical examination without abnormal findings: Secondary | ICD-10-CM

## 2017-07-07 DIAGNOSIS — I10 Essential (primary) hypertension: Secondary | ICD-10-CM | POA: Diagnosis not present

## 2017-07-07 DIAGNOSIS — Z23 Encounter for immunization: Secondary | ICD-10-CM | POA: Diagnosis not present

## 2017-07-07 LAB — HEPATIC FUNCTION PANEL
ALT: 8 U/L (ref 0–35)
AST: 13 U/L (ref 0–37)
Albumin: 4.2 g/dL (ref 3.5–5.2)
Alkaline Phosphatase: 51 U/L (ref 39–117)
BILIRUBIN DIRECT: 0.1 mg/dL (ref 0.0–0.3)
BILIRUBIN TOTAL: 0.5 mg/dL (ref 0.2–1.2)
Total Protein: 7 g/dL (ref 6.0–8.3)

## 2017-07-07 LAB — BASIC METABOLIC PANEL
BUN: 10 mg/dL (ref 6–23)
CO2: 30 mEq/L (ref 19–32)
Calcium: 8.8 mg/dL (ref 8.4–10.5)
Chloride: 101 mEq/L (ref 96–112)
Creatinine, Ser: 0.71 mg/dL (ref 0.40–1.20)
GFR: 98.97 mL/min (ref >60.00)
Glucose, Bld: 101 mg/dL — ABNORMAL HIGH (ref 70–99)
POTASSIUM: 3.7 meq/L (ref 3.5–5.1)
Sodium: 137 mEq/L (ref 135–145)

## 2017-07-07 LAB — LIPID PANEL
CHOLESTEROL: 193 mg/dL (ref 0–200)
HDL: 93.2 mg/dL (ref >39.00)
LDL CALC: 87 mg/dL (ref 0–99)
NonHDL: 99.5
Total CHOL/HDL Ratio: 2
Triglycerides: 64 mg/dL (ref 0.0–149.0)
VLDL: 12.8 mg/dL (ref 0.0–40.0)

## 2017-07-07 LAB — TSH: TSH: 1.43 u[IU]/mL (ref 0.35–4.50)

## 2017-07-07 NOTE — Patient Instructions (Signed)
It was great seeing you today   I will follow up with you regarding your lab work and blood pressure medication   If you need anything, please let me know

## 2017-07-07 NOTE — Progress Notes (Signed)
Subjective:    Patient ID: Elizabeth Carr, female    DOB: 23-Nov-1980, 36 y.o.   MRN: [id number]  HPI  Patient presents for yearly preventative medicine examination. She is a pleasant 36 year old female who  has a past medical history of Anemia, Hemochromatosis, Ovarian cyst, and Pregnancy induced hypertension.  She has noticed that her BP has been elevated. It was elevated at [facility] last month and she has been monitoring it at home. She reports readings between 117/70-150's/90;s. Most of her blood pressure readings are in the 140's. She has a strong family history of hypertension. Today in the office BP is 160/90.   All immunizations and health maintenance protocols were reviewed with the patient and needed orders were placed. She is due for Td  Appropriate screening laboratory values were ordered for the patient including screening of hyperlipidemia, renal function and hepatic function.  Medication reconciliation,  past medical history, social history, problem list and allergies were reviewed in detail with the patient  Goals were established with regard to weight loss, exercise, and  diet in compliance with medications  She is UTD on Pap ( done by GYN), dental and vision screens.    She has no acute complaints   Review of Systems  Constitutional: Negative.   Eyes: Negative.   Respiratory: Negative.   Cardiovascular: Negative.   Gastrointestinal: Negative.   Endocrine: Negative.   Genitourinary: Negative.   Skin: Negative.   Allergic/Immunologic: Negative.   Neurological: Negative.   Hematological: Negative.   Psychiatric/Behavioral: Negative.   All other systems reviewed and are negative.  Past Medical History:  Diagnosis Date  . Anemia   . Hemochromatosis   . Ovarian cyst   . Pregnancy induced hypertension    with first pregnancy    Social History   Socioeconomic History  . Marital status: Married    Spouse name: Not on file  . Number of children: Not on file   . Years of education: Not on file  . Highest education level: Not on file  Social Needs  . Financial resource strain: Not on file  . Food insecurity - worry: Not on file  . Food insecurity - inability: Not on file  . Transportation needs - medical: Not on file  . Transportation needs - non-medical: Not on file  Occupational History  . Not on file  Tobacco Use  . Smoking status: Never Smoker  . Smokeless tobacco: Never Used  Substance and Sexual Activity  . Alcohol use: Yes    Comment: rare  . Drug use: No  . Sexual activity: Not on file  Other Topics Concern  . 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    Past Surgical History:  Procedure Laterality Date  . CERVICAL BIOPSY  W/ LOOP ELECTRODE EXCISION     2010  . CESAREAN SECTION     x 2  . wisdon teeth extraction      Family History  Problem Relation Age of Onset  . Hypertension Mother   . Hemochromatosis Mother   . Hypertension Father   . Hypertension Maternal Grandmother   . Hypothyroidism Maternal Grandmother   . Pancreatic cancer Maternal Grandfather   . Hemochromatosis Brother     No Known Allergies  Current Outpatient Medications on File Prior to Visit  Medication Sig Dispense Refill  .  norgestimate-ethinyl estradiol (ORTHO-CYCLEN,SPRINTEC,PREVIFEM) 0.25-35 MG-MCG tablet Take 1 tablet by mouth daily.     No current facility-administered medications on file prior to visit.     BP (!) 160/90 (BP Location: Left Arm, Cuff Size: Normal)   Temp 98.3 F (36.8 C) (Oral)   Ht 5' 1.5" (1.562 m)   Wt 130 lb (59 kg)   LMP 06/30/2017   BMI 24.17 kg/m       Objective:   Physical Exam  Constitutional: She is oriented to person, place, and time. She appears well-developed and well-nourished. No distress.  HENT:  Head: Normocephalic and atraumatic.  Right Ear: External ear normal.  Left Ear: External ear normal.    Nose: Nose normal.  Mouth/Throat: Oropharynx is clear and moist. No oropharyngeal exudate.  Cardiovascular: Normal rate, regular rhythm, normal heart sounds and intact distal pulses. Exam reveals no gallop and no friction rub.  No murmur heard. Pulmonary/Chest: Effort normal and breath sounds normal. No respiratory distress. She has no wheezes. She has no rales. She exhibits no tenderness.  Abdominal: Soft. Bowel sounds are normal. She exhibits no distension and no mass. There is no tenderness. There is no rebound and no guarding.  Genitourinary:  Genitourinary Comments: Deferred   Musculoskeletal: Normal range of motion. She exhibits no edema, tenderness or deformity.  Neurological: She is alert and oriented to person, place, and time. She has normal reflexes. She displays normal reflexes. No cranial nerve deficit. She exhibits normal muscle tone. Coordination normal.  Skin: Skin is warm and dry. No rash noted. She is not diaphoretic. No erythema. No pallor.  Psychiatric: She has a normal mood and affect. Her behavior is normal. Judgment and thought content normal.  Nursing note and vitals reviewed.     Assessment & Plan:   1. Routine general medical examination at a health care facility  - Basic metabolic panel - Hepatic function panel - Lipid panel - TSH  2. Need for Td vaccine  - Td : Tetanus/diphtheria >7yo Preservative  free  3. Essential hypertension - Will check labs. CBC was checked last month and was normal - Likely place on lisinopril 2.5 mg and have her monitor at home  - Basic metabolic panel - Hepatic function panel - Lipid panel - TSH  Shirline Freesory Scottie Stanish, NP

## 2017-07-08 ENCOUNTER — Other Ambulatory Visit: Payer: Self-pay | Admitting: Adult Health

## 2017-07-08 MED ORDER — LISINOPRIL 2.5 MG PO TABS: 2.5000 mg | ORAL_TABLET | Freq: Every day | ORAL | 1 refills | Status: DC

## 2017-07-10 ENCOUNTER — Telehealth: Payer: Self-pay | Admitting: *Deleted

## 2017-07-10 NOTE — Telephone Encounter (Signed)
Called patient on home number, person who answered asked me to call her at work # [phone]. Person who answered stated she was at lunch and would have patient call back.

## 2017-07-10 NOTE — Telephone Encounter (Signed)
It is ok to come back in one month

## 2017-07-10 NOTE — Telephone Encounter (Signed)
Pt started lisinopril 2.5 mg.  Please advise.

## 2017-07-10 NOTE — Telephone Encounter (Signed)
Copied from CRM [phone]. Topic: Inquiry >> Jul 10, 2017 11:55 AM Crist InfanteHarrald, Kathy J wrote: Reason for CRM: pt wants to know when Kandee KeenCory would have her come for return visit, since she is starting something new, when should she return?

## 2017-07-14 NOTE — Telephone Encounter (Signed)
Called patient and left message to return call to schedule 1 month follow-up.

## 2017-08-07 ENCOUNTER — Ambulatory Visit: Payer: 59 | Admitting: Adult Health

## 2017-08-07 ENCOUNTER — Encounter: Payer: Self-pay | Admitting: Adult Health

## 2017-08-07 VITALS — BP 102/78 | HR 71 | Temp 97.9°F | Ht 61.5 in | Wt 129.7 lb

## 2017-08-07 DIAGNOSIS — G8929 Other chronic pain: Secondary | ICD-10-CM

## 2017-08-07 DIAGNOSIS — M5442 Lumbago with sciatica, left side: Secondary | ICD-10-CM | POA: Diagnosis not present

## 2017-08-07 DIAGNOSIS — I10 Essential (primary) hypertension: Secondary | ICD-10-CM

## 2017-08-07 NOTE — Progress Notes (Signed)
Subjective:    Patient ID: Elizabeth Carr, female    DOB: [birth year], 37 y.o.   MRN: [id number]  HPI 37 year old female who  has a past medical history of Anemia, Hemochromatosis, Ovarian cyst, and Pregnancy induced hypertension. She presents to the office today for follow up regarding blood pressure. She has a strong family history of hypertension and her blood pressure had been steadily increasing. She was started on lisinopril 2.5 mg. Today in the office she reports that she has been monitoring her BP at home and reports readings between 99-120/60-80. She denies any dizziness, lightheaded, or syncope.  BP Readings from Last 3 Encounters:  08/07/17 102/78  07/07/17 (!) 160/90  06/10/17 (!) 142/77    She also reports that for the last 9 months or so she has had left lower back pain that is positional in nature. She notices the pain when she wakes up in the morning and when sitting for long periods of time. She does reports episodes of numbness and tingling down the outside of the left leg. Her symptoms resolve after change in position.    Review of Systems See HPI   Past Medical History:  Diagnosis Date  . Anemia   . Hemochromatosis   . Ovarian cyst   . Pregnancy induced hypertension    with first pregnancy    Social History   Socioeconomic History  . Marital status: Married    Spouse name: Not on file  . Number of children: Not on file  . Years of education: Not on file  . Highest education level: Not on file  Social Needs  . Financial resource strain: Not on file  . Food insecurity - worry: Not on file  . Food insecurity - inability: Not on file  . Transportation needs - medical: Not on file  . Transportation needs - non-medical: Not on file  Occupational History  . Not on file  Tobacco Use  . Smoking status: Never Smoker  . Smokeless tobacco: Never Used  Substance and Sexual Activity  . Alcohol use: Yes    Comment: rare  . Drug use: No  . Sexual activity:  Not on file  Other Topics Concern  . 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    Past Surgical History:  Procedure Laterality Date  . CERVICAL BIOPSY  W/ LOOP ELECTRODE EXCISION     2010  . CESAREAN SECTION     x 2  . wisdon teeth extraction      Family History  Problem Relation Age of Onset  . Hypertension Mother   . Hemochromatosis Mother   . Hypertension Father   . Hypertension Maternal Grandmother   . Hypothyroidism Maternal Grandmother   . Pancreatic cancer Maternal Grandfather   . Hemochromatosis Brother     No Known Allergies  Current Outpatient Medications on File Prior to Visit  Medication Sig Dispense Refill  . lisinopril (ZESTRIL) 2.5 MG tablet Take 1 tablet (2.5 mg total) by mouth daily. 90 tablet 1  . norgestimate-ethinyl estradiol (ORTHO-CYCLEN,SPRINTEC,PREVIFEM) 0.25-35 MG-MCG tablet Take 1 tablet by mouth daily.     No current facility-administered medications on file prior to visit.     BP 102/78 (BP Location: Left Arm, Patient Position: Sitting, Cuff Size: Normal)   Pulse 71   Temp 97.9 F (36.6 C) (Oral)  Ht 5' 1.5" (1.562 m)   Wt 129 lb 11.2 oz (58.8 kg)   SpO2 100%   BMI 24.11 kg/m       Objective:   Physical Exam  Constitutional: She is oriented to person, place, and time. She appears well-developed and well-nourished. No distress.  Cardiovascular: Normal rate, regular rhythm, normal heart sounds and intact distal pulses. Exam reveals no gallop.  No murmur heard. Pulmonary/Chest: Effort normal and breath sounds normal. No respiratory distress. She has no wheezes. She has no rales. She exhibits no tenderness.  Musculoskeletal: Normal range of motion. She exhibits no edema or tenderness.  Neurological: She is alert and oriented to person, place, and time.  Skin: Skin is warm and dry. She is not diaphoretic.  Psychiatric: She has a normal mood  and affect. Her behavior is normal. Thought content normal.  Nursing note and vitals reviewed.     Assessment & Plan:  1. Essential hypertension - continue with lisinopril 2.5 mg   2. Chronic left-sided low back pain with left-sided sciatica  - DG Lumbar Spine Complete; Future   Shirline Frees, NP

## 2017-08-14 ENCOUNTER — Ambulatory Visit (INDEPENDENT_AMBULATORY_CARE_PROVIDER_SITE_OTHER)
Admission: RE | Admit: 2017-08-14 | Discharge: 2017-08-14 | Disposition: A | Discharge status: Home / Self Care | Payer: 59 | Source: Ambulatory Visit | Attending: Adult Health | Admitting: Adult Health

## 2017-08-14 DIAGNOSIS — G8929 Other chronic pain: Secondary | ICD-10-CM | POA: Diagnosis not present

## 2017-08-14 DIAGNOSIS — M5442 Lumbago with sciatica, left side: Secondary | ICD-10-CM | POA: Diagnosis not present

## 2017-08-14 DIAGNOSIS — M545 Low back pain: Secondary | ICD-10-CM | POA: Diagnosis not present

## 2017-08-29 DIAGNOSIS — J0140 Acute pansinusitis, unspecified: Secondary | ICD-10-CM | POA: Diagnosis not present

## 2017-08-29 DIAGNOSIS — I10 Essential (primary) hypertension: Secondary | ICD-10-CM | POA: Diagnosis not present

## 2017-11-24 DIAGNOSIS — Z01419 Encounter for gynecological examination (general) (routine) without abnormal findings: Secondary | ICD-10-CM | POA: Diagnosis not present

## 2017-11-24 DIAGNOSIS — Z6823 Body mass index (BMI) 23.0-23.9, adult: Secondary | ICD-10-CM | POA: Diagnosis not present

## 2017-12-31 ENCOUNTER — Other Ambulatory Visit: Payer: Self-pay | Admitting: Adult Health

## 2017-12-31 NOTE — Telephone Encounter (Signed)
SENT TO THE PHARMACY BY E-SCRIBE. 

## 2018-04-21 DIAGNOSIS — Z23 Encounter for immunization: Secondary | ICD-10-CM | POA: Diagnosis not present

## 2018-06-10 ENCOUNTER — Other Ambulatory Visit (HOSPITAL_COMMUNITY): Payer: Self-pay

## 2018-06-10 DIAGNOSIS — D509 Iron deficiency anemia, unspecified: Secondary | ICD-10-CM

## 2018-06-11 ENCOUNTER — Other Ambulatory Visit (HOSPITAL_COMMUNITY): Payer: 59

## 2018-06-14 ENCOUNTER — Ambulatory Visit: Payer: 59 | Admitting: Family Medicine

## 2018-06-14 ENCOUNTER — Ambulatory Visit (INDEPENDENT_AMBULATORY_CARE_PROVIDER_SITE_OTHER): Payer: 59

## 2018-06-14 ENCOUNTER — Encounter: Payer: Self-pay | Admitting: Family Medicine

## 2018-06-14 VITALS — BP 116/72 | HR 93 | Temp 99.2°F | Ht 61.5 in | Wt 133.0 lb

## 2018-06-14 DIAGNOSIS — R05 Cough: Secondary | ICD-10-CM | POA: Diagnosis not present

## 2018-06-14 DIAGNOSIS — R059 Cough, unspecified: Secondary | ICD-10-CM

## 2018-06-14 MED ORDER — BENZONATATE 200 MG PO CAPS: 200.0000 mg | ORAL_CAPSULE | Freq: Three times a day (TID) | ORAL | 0 refills | Status: DC | PRN

## 2018-06-14 MED ORDER — AZITHROMYCIN 250 MG PO TABS: ORAL_TABLET | ORAL | 0 refills | Status: DC

## 2018-06-14 NOTE — Progress Notes (Signed)
Patient: Elizabeth Carr MRN: [id number] DOB: 1980/09/06 PCP: Shirline Frees, NP     Subjective:  Chief Complaint  Patient presents with  . Cough    x 2 wks    HPI: The patient is a 37 y.o. female who presents today for cough x 2 weeks. She states she got a cold 2 weeks ago. She started to feel better last week around Wednesday-Thursday, but then her cough got a lot worse over the weekend. Last night she started to have chest pain with her coughs and after coughing spells. It also hurts in her back. She states this morning she started to feel worse and got body aches. She did do a flu test at work and it's negative. Her son had pneumonia a few weeks ago and she is around sick kids all day long. She does not smoke and no hx of asthma. She has not taken any otc medication. No fever that she is aware of. She denies any wheezing. Shortness of breath only with coughing spells.   Review of Systems  Constitutional: Positive for chills. Negative for fatigue and fever.  HENT: Positive for congestion and postnasal drip. Negative for ear pain, sinus pressure, sinus pain and sore throat.   Respiratory: Positive for cough and shortness of breath.        Occ SOB with cough  Cardiovascular: Negative for chest pain.  Gastrointestinal: Negative for abdominal pain and nausea.  Musculoskeletal: Positive for back pain.       Pain under left shoulder blade with coughing  Neurological: Negative for dizziness and headaches.  Psychiatric/Behavioral: Positive for sleep disturbance.    Allergies Patient has No Known Allergies.  Past Medical History Patient  has a past medical history of Anemia, Hemochromatosis, Ovarian cyst, and Pregnancy induced hypertension.  Surgical History Patient  has a past surgical history that includes wisdon teeth extraction; Cesarean section; and Cervical biopsy w/ loop electrode excision.  Family History Pateint's family history includes Hemochromatosis in her brother and  mother; Hypertension in her father, maternal grandmother, and mother; Hypothyroidism in her maternal grandmother; Pancreatic cancer in her maternal grandfather.  Social History Patient  reports that she has never smoked. She has never used smokeless tobacco. She reports that she drinks alcohol. She reports that she does not use drugs.    Objective: Vitals:   06/14/18 1406  BP: 116/72  Pulse: 93  Temp: 99.2 F (37.3 C)  TempSrc: Oral  SpO2: 99%  Weight: 133 lb (60.3 kg)  Height: 5' 1.5" (1.562 m)    Body mass index is 24.72 kg/m.  Physical Exam  Constitutional: She is oriented to person, place, and time. She appears well-developed and well-nourished.  HENT:  Right Ear: External ear normal.  Left Ear: External ear normal.  Mouth/Throat: Oropharynx is clear and moist. No oropharyngeal exudate.  Tm pearly with light reflex bilaterally    Neck: Normal range of motion. Neck supple. No thyromegaly present.  Cardiovascular: Normal rate, regular rhythm and normal heart sounds.  Pulmonary/Chest: Effort normal and breath sounds normal. She has no wheezes. She has no rales.  Abdominal: Soft. Bowel sounds are normal.  Lymphadenopathy:    She has no cervical adenopathy.  Neurological: She is alert and oriented to person, place, and time.  Skin: Skin is warm. Capillary refill takes less than 2 seconds.  Vitals reviewed.      CXR: possible early consolidation in RUL near mediastinum vs. bronchitis. official read pending.   Assessment/plan: 1. Cough Early pneumonia  vs. Bronchitis. Zpack and conservative therapy. Recommended cool mist humidifier at night, honey, robitussin DM/nyquil and will send in tessalon pearls. She is likely getting pleurisy from coughing fits. Discussed treatment with NSAIDs, deep breathing, heating pad. Fever/worsening symptoms: ER.  - DG Chest 2 View   Return if symptoms worsen or fail to improve.   Orland Mustard, MD [facility]    06/14/2018

## 2018-06-14 NOTE — Patient Instructions (Signed)
Early pnuemonia vs. Bronchitis.  Have small consolidation in RUL>   -zpack -cool mist humidifier at night -tessalon pearls prn. Robitussin DM and nyquil at night -honey is great as well.

## 2018-06-18 ENCOUNTER — Ambulatory Visit (HOSPITAL_COMMUNITY): Payer: 59 | Admitting: Internal Medicine

## 2018-06-22 ENCOUNTER — Other Ambulatory Visit: Payer: Self-pay | Admitting: Adult Health

## 2018-06-22 NOTE — Telephone Encounter (Signed)
Sent to the pharmacy by e-scribe.  Pt has upcoming cpx on 07/09/18.

## 2018-07-06 ENCOUNTER — Inpatient Hospital Stay (HOSPITAL_COMMUNITY): Payer: 59 | Attending: Hematology | Admitting: Internal Medicine

## 2018-07-06 ENCOUNTER — Other Ambulatory Visit: Payer: Self-pay

## 2018-07-06 ENCOUNTER — Encounter (HOSPITAL_COMMUNITY): Payer: Self-pay | Admitting: Internal Medicine

## 2018-07-06 VITALS — BP 118/72 | HR 57 | Temp 97.5°F | Resp 14 | Wt 132.6 lb

## 2018-07-06 DIAGNOSIS — D508 Other iron deficiency anemias: Secondary | ICD-10-CM

## 2018-07-06 DIAGNOSIS — N920 Excessive and frequent menstruation with regular cycle: Secondary | ICD-10-CM | POA: Insufficient documentation

## 2018-07-06 DIAGNOSIS — Z8249 Family history of ischemic heart disease and other diseases of the circulatory system: Secondary | ICD-10-CM | POA: Diagnosis not present

## 2018-07-06 DIAGNOSIS — Z79899 Other long term (current) drug therapy: Secondary | ICD-10-CM | POA: Diagnosis not present

## 2018-07-06 DIAGNOSIS — D509 Iron deficiency anemia, unspecified: Secondary | ICD-10-CM | POA: Diagnosis not present

## 2018-07-06 NOTE — Progress Notes (Signed)
Diagnosis Iron deficiency anemia, unspecified iron deficiency anemia type  Other iron deficiency anemia - Plan: CBC with Differential/Platelet, Comprehensive metabolic panel, Ferritin, Iron and TIBC, Transferrin Saturation  Other hemochromatosis - Plan: CBC with Differential/Platelet, Comprehensive metabolic panel, Ferritin, Iron and TIBC, Transferrin Saturation  Staging Cancer Staging No matching staging information was found for the patient.  Assessment and Plan:  1.  Hereditary hemochromatosis.  Pt was previously followed by Dr. Janyth Contes.  She brings in outside lab report dated 06/10/2018 that was reviewed and showed WBC 6 HB 12.6 plts 295,000.  Ferritin remains decreased at 5.  Labs dating back to 2015 show ferritin has ranged from 6 to 12.  Pt has not required phlebotomies since ferritin has remained below 50 due to blood loss from monthly menstruation.  Once post-menopausal, she may require regular phlebotomies to maintain ferritin at 50 or less.  Pt will RTC in 1 year for follow-up with labs.    2.  IDA.  Ferritin remains decreased at 5.  Labs dating back to 2015 show ferritin has ranged from 6 to 12.  Iron replacement has not been recommended due to hemochromatosis.    3.  Menorrhagia.  Pt reports recent menstrual cycles have been heavy.  Pt should follow-up with PCP or GYN for evaluation.    Interval History:  Historical data obtained from note dated 06/10/2017.  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   Current Status:  Pt is seen today for follow-up.  She brings in copies of recent labs.    Problem List Patient Active Problem List   Diagnosis Date Noted  . Iron deficiency anemia [D50.9] 06/11/2017  . Hemochromatosis [E83.119] 03/15/2014    Past Medical History Past Medical History:  Diagnosis Date  . Anemia   .  Hemochromatosis   . Ovarian cyst   . Pregnancy induced hypertension    with first pregnancy    Past Surgical History Past Surgical History:  Procedure Laterality Date  . CERVICAL BIOPSY  W/ LOOP ELECTRODE EXCISION     2010  . CESAREAN SECTION     x 2  . wisdon teeth extraction      Family History Family History  Problem Relation Age of Onset  . Hypertension Mother   . Hemochromatosis Mother   . Hypertension Father   . Hypertension Maternal Grandmother   . Hypothyroidism Maternal Grandmother   . Pancreatic cancer Maternal Grandfather   . Hemochromatosis Brother      Social History  reports that she has never smoked. She has never used smokeless tobacco. She reports that she drinks alcohol. She reports that she does not use drugs.  Medications  Current Outpatient Medications:  .  lisinopril (PRINIVIL,ZESTRIL) 2.5 MG tablet, TAKE 1 TABLET BY MOUTH EVERY DAY, Disp: 30 tablet, Rfl: 0 .  norgestimate-ethinyl estradiol (ORTHO-CYCLEN,SPRINTEC,PREVIFEM) 0.25-35 MG-MCG tablet, Take 1 tablet by mouth daily., Disp: , Rfl:   Allergies Patient has no known allergies.  Review of Systems Review of Systems - Oncology ROS negative   Physical Exam  Vitals Wt Readings from Last 3 Encounters:  07/06/18 132 lb 9.6 oz (60.1 kg)  06/14/18 133 lb (60.3 kg)  08/07/17 129 lb 11.2 oz (58.8 kg)   Temp Readings from Last 3 Encounters:  07/06/18 (!) 97.5 F (36.4 C) (Oral)  06/14/18 99.2 F (37.3 C) (Oral)  08/07/17 97.9  F (36.6 C) (Oral)   BP Readings from Last 3 Encounters:  07/06/18 118/72  06/14/18 116/72  08/07/17 102/78   Pulse Readings from Last 3 Encounters:  07/06/18 (!) 57  06/14/18 93  08/07/17 71   Constitutional: Well-developed, well-nourished, and in no distress.   HENT: Head: Normocephalic and atraumatic.  Mouth/Throat: No oropharyngeal exudate. Mucosa moist. Eyes: Pupils are equal, round, and reactive to light. Conjunctivae are normal. No scleral icterus.   Neck: Normal range of motion. Neck supple. No JVD present.  Cardiovascular: Normal rate, regular rhythm and normal heart sounds.  Exam reveals no gallop and no friction rub.   No murmur heard. Pulmonary/Chest: Effort normal and breath sounds normal. No respiratory distress. No wheezes.No rales.  Abdominal: Soft. Bowel sounds are normal. No distension. There is no tenderness. There is no guarding.  Musculoskeletal: No edema or tenderness.  Lymphadenopathy: No cervical, axillary or supraclavicular adenopathy.  Neurological: Alert and oriented to person, place, and time. No cranial nerve deficit.  Skin: Skin is warm and dry. No rash noted. No erythema. No pallor.  Psychiatric: Affect and judgment normal.   Labs No visits with results within 3 Day(s) from this visit.  Latest known visit with results is:  Office Visit on 07/07/2017  Component Date Value Ref Range Status  . Sodium 07/07/2017 137  135 - 145 mEq/L Final  . Potassium 07/07/2017 3.7  3.5 - 5.1 mEq/L Final  . Chloride 07/07/2017 101  96 - 112 mEq/L Final  . CO2 07/07/2017 30  19 - 32 mEq/L Final  . Glucose, Bld 07/07/2017 101* 70 - 99 mg/dL Final  . BUN 16/05/9603 10  6 - 23 mg/dL Final  . Creatinine, Ser 07/07/2017 0.71  0.40 - 1.20 mg/dL Final  . Calcium 54/04/8118 8.8  8.4 - 10.5 mg/dL Final  . GFR 14/78/2956 98.97  >60.00 mL/min Final  . Total Bilirubin 07/07/2017 0.5  0.2 - 1.2 mg/dL Final  . Bilirubin, Direct 07/07/2017 0.1  0.0 - 0.3 mg/dL Final  . Alkaline Phosphatase 07/07/2017 51  39 - 117 U/L Final  . AST 07/07/2017 13  0 - 37 U/L Final  . ALT 07/07/2017 8  0 - 35 U/L Final  . Total Protein 07/07/2017 7.0  6.0 - 8.3 g/dL Final  . Albumin 21/30/8657 4.2  3.5 - 5.2 g/dL Final  . Cholesterol 84/69/6295 193  0 - 200 mg/dL Final   ATP III Classification       Desirable:  < 200 mg/dL               Borderline High:  200 - 239 mg/dL          High:  > = 284 mg/dL  . Triglycerides 07/07/2017 64.0  0.0 - 149.0 mg/dL Final    Normal:  <132 mg/dLBorderline High:  150 - 199 mg/dL  . HDL 07/07/2017 93.20  >39.00 mg/dL Final  . VLDL 44/08/270 12.8  0.0 - 40.0 mg/dL Final  . LDL Cholesterol 07/07/2017 87  0 - 99 mg/dL Final  . Total CHOL/HDL Ratio 07/07/2017 2   Final                  Men          Women1/2 Average Risk     3.4          3.3Average Risk          5.0          4.42X Average Risk  9.6          7.13X Average Risk          15.0          11.0                      . NonHDL 07/07/2017 99.50   Final   NOTE:  Non-HDL goal should be 30 mg/dL higher than patient's LDL goal (i.e. LDL goal of < 70 mg/dL, would have non-HDL goal of < 100 mg/dL)  . TSH 07/07/2017 1.43  0.35 - 4.50 uIU/mL Final     Pathology Orders Placed This Encounter  Procedures  . CBC with Differential/Platelet    Standing Status:   Future    Standing Expiration Date:   07/06/2020  . Comprehensive metabolic panel    Standing Status:   Future    Standing Expiration Date:   07/06/2020  . Ferritin    Standing Status:   Future    Standing Expiration Date:   07/06/2020  . Iron and TIBC    Standing Status:   Future    Standing Expiration Date:   07/06/2020  . Transferrin Saturation    Standing Status:   Future    Standing Expiration Date:   07/06/2020       Ahmed PrimaVetta Lamica Mccart MD

## 2018-07-06 NOTE — Patient Instructions (Signed)
[facility]  Discharge Instructions: You saw Dr. Higgs today                               _______________________________________________________________  Thank you for choosing [facility] to provide your oncology and hematology care.  To afford each patient quality time with our providers, please arrive at least 15 minutes before your scheduled appointment.  You need to re-schedule your appointment if you arrive 10 or more minutes late.  We strive to give you quality time with our providers, and arriving late affects you and other patients whose appointments are after yours.  Also, if you no show three or more times for appointments you may be dismissed from the clinic.  Again, thank you for choosing [facility]. Our hope is that these requests will allow you access to exceptional care and in a timely manner. _______________________________________________________________  If you have questions after your visit, please contact our office at [phone] between the hours of 8:30 a.m. and 5:00 p.m. Voicemails left after 4:30 p.m. will not be returned until the following business day. _______________________________________________________________  For prescription refill requests, have your pharmacy contact our office. _______________________________________________________________  Recommendations made by the consultant and any test results will be sent to your referring physician. _______________________________________________________________ 

## 2018-07-09 ENCOUNTER — Telehealth: Payer: Self-pay | Admitting: Adult Health

## 2018-07-09 ENCOUNTER — Ambulatory Visit (INDEPENDENT_AMBULATORY_CARE_PROVIDER_SITE_OTHER): Payer: 59

## 2018-07-09 ENCOUNTER — Encounter: Payer: Self-pay | Admitting: Adult Health

## 2018-07-09 ENCOUNTER — Ambulatory Visit (INDEPENDENT_AMBULATORY_CARE_PROVIDER_SITE_OTHER): Payer: 59 | Admitting: Adult Health

## 2018-07-09 VITALS — BP 106/76 | Temp 98.0°F | Ht 61.5 in | Wt 133.0 lb

## 2018-07-09 DIAGNOSIS — Z0000 Encounter for general adult medical examination without abnormal findings: Secondary | ICD-10-CM

## 2018-07-09 DIAGNOSIS — I10 Essential (primary) hypertension: Secondary | ICD-10-CM | POA: Insufficient documentation

## 2018-07-09 DIAGNOSIS — R05 Cough: Secondary | ICD-10-CM | POA: Diagnosis not present

## 2018-07-09 DIAGNOSIS — R059 Cough, unspecified: Secondary | ICD-10-CM

## 2018-07-09 DIAGNOSIS — R079 Chest pain, unspecified: Secondary | ICD-10-CM | POA: Diagnosis not present

## 2018-07-09 LAB — TSH: TSH: 1.71 u[IU]/mL (ref 0.35–4.50)

## 2018-07-09 LAB — LIPID PANEL
CHOL/HDL RATIO: 2
Cholesterol: 188 mg/dL (ref 0–200)
HDL: 96 mg/dL (ref >39.00)
LDL CALC: 74 mg/dL (ref 0–99)
NonHDL: 92.22
TRIGLYCERIDES: 92 mg/dL (ref 0.0–149.0)
VLDL: 18.4 mg/dL (ref 0.0–40.0)

## 2018-07-09 MED ORDER — DOXYCYCLINE HYCLATE 100 MG PO CAPS: 100.0000 mg | ORAL_CAPSULE | Freq: Two times a day (BID) | ORAL | 0 refills | Status: DC

## 2018-07-09 NOTE — Progress Notes (Signed)
Subjective:    Patient ID: Elizabeth Carr, female    DOB: Aug 13, 1980, 37 y.o.   MRN: [id number]  HPI Patient presents for yearly preventative medicine examination. She is a pleasant 37 year old female who  has a past medical history of Anemia, Hemochromatosis, Ovarian cyst, and Pregnancy induced hypertension.  Essential Hypertension - strong family history. She has been well controlled over the last year with Lisinopril 2.5 mg  BP Readings from Last 3 Encounters:  07/09/18 106/76  07/06/18 118/72  06/14/18 116/72   Hereditary hemochromatosis- is followed by hematology/oncology. Last visit three days ago.  Pt has not required phlebotomies since ferritin has remained below 50 due to blood loss from monthly menstruation  Iron deficiency anemia - followed by hematology/oncology. Not recommended iron replacement due to hemochromatosis   Pneumonia - Was seen by another provider on 06/14/2018 and diagnosed with pneumonia. She was prescribed Z pak. She reports that she felt improved for a week after the antibiotics but she has a semi productive cough that has returned and left sided chest wall pain. She denies fevers or feeling acutely ill.   All immunizations and health maintenance protocols were reviewed with the patient and needed orders were placed. utd  Appropriate screening laboratory values were ordered for the patient including screening of hyperlipidemia, renal function and hepatic function.  Medication reconciliation,  past medical history, social history, problem list and allergies were reviewed in detail with the patient  Goals were established with regard to weight loss, exercise, and  diet in compliance with medications. She is active and exercises multiple times per week. She continues to eat healthy  Wt Readings from Last 3 Encounters:  07/09/18 133 lb (60.3 kg)  07/06/18 132 lb 9.6 oz (60.1 kg)  06/14/18 133 lb (60.3 kg)   She is up to date on routine paps ( has gyn).     Review of Systems  Constitutional: Negative.   HENT: Negative.   Eyes: Negative.   Respiratory: Positive for cough. Negative for chest tightness, shortness of breath and wheezing.   Cardiovascular: Negative.   Gastrointestinal: Negative.   Endocrine: Negative.   Genitourinary: Negative.   Musculoskeletal: Negative.   Skin: Negative.   Allergic/Immunologic: Negative.   Neurological: Negative.   Hematological: Negative.   Psychiatric/Behavioral: Negative.    Past Medical History:  Diagnosis Date  . Anemia   . Hemochromatosis   . Ovarian cyst   . Pregnancy induced hypertension    with first pregnancy    Social History   Socioeconomic History  . Marital status: Married    Spouse name: Not on file  . Number of children: Not on file  . Years of education: Not on file  . Highest education level: Not on file  Occupational History  . Not on file  Social Needs  . Financial resource strain: Not on file  . Food insecurity:    Worry: Not on file    Inability: Not on file  . Transportation needs:    Medical: Not on file    Non-medical: Not on file  Tobacco Use  . Smoking status: Never Smoker  . Smokeless tobacco: Never Used  Substance and Sexual Activity  . Alcohol use: Yes    Comment: rare  . Drug use: No  . Sexual activity: Not on file  Lifestyle  . Physical activity:    Days per week: Not on file    Minutes per session: Not on file  . Stress: Not on  file  Relationships  . Social connections:    Talks on phone: Not on file    Gets together: Not on file    Attends religious service: Not on file    Active member of club or organization: Not on file    Attends meetings of clubs or organizations: Not on file    Relationship status: Not on file  . Intimate partner violence:    Fear of current or ex partner: Not on file    Emotionally abused: Not on file    Physically abused: Not on file    Forced sexual activity: Not on file  Other Topics Concern  . 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    Past Surgical History:  Procedure Laterality Date  . CERVICAL BIOPSY  W/ LOOP ELECTRODE EXCISION     2010  . CESAREAN SECTION     x 2  . wisdon teeth extraction      Family History  Problem Relation Age of Onset  . Hypertension Mother   . Hemochromatosis Mother   . Hypertension Father   . Hypertension Maternal Grandmother   . Hypothyroidism Maternal Grandmother   . Pancreatic cancer Maternal Grandfather   . Hemochromatosis Brother     No Known Allergies  Current Outpatient Medications on File Prior to Visit  Medication Sig Dispense Refill  . lisinopril (PRINIVIL,ZESTRIL) 2.5 MG tablet TAKE 1 TABLET BY MOUTH EVERY DAY 30 tablet 0  . norgestimate-ethinyl estradiol (ORTHO-CYCLEN,SPRINTEC,PREVIFEM) 0.25-35 MG-MCG tablet Take 1 tablet by mouth daily.     No current facility-administered medications on file prior to visit.     BP 106/76   Temp 98 F (36.7 C)   Ht 5' 1.5" (1.562 m) Comment: WITHOUT SHOES  Wt 133 lb (60.3 kg)   BMI 24.72 kg/m       Objective:   Physical Exam  Constitutional: She is oriented to person, place, and time. She appears well-developed and well-nourished. No distress.  HENT:  Head: Normocephalic and atraumatic.  Right Ear: External ear normal.  Left Ear: External ear normal.  Nose: Nose normal.  Mouth/Throat: Oropharynx is clear and moist. No oropharyngeal exudate.  Eyes: Pupils are equal, round, and reactive to light. Conjunctivae and EOM are normal. Right eye exhibits no discharge. Left eye exhibits no discharge. No scleral icterus.  Neck: Normal range of motion. Neck supple. No JVD present. No tracheal deviation present. No thyromegaly present.  Cardiovascular: Normal rate, regular rhythm, normal heart sounds and intact distal pulses. Exam reveals no gallop and no friction rub.  No murmur  heard. Pulmonary/Chest: Effort normal and breath sounds normal. No stridor. No respiratory distress. She has no wheezes. She has no rales. She exhibits no tenderness.  Abdominal: Soft. Bowel sounds are normal. She exhibits no distension and no mass. There is no tenderness. There is no rebound and no guarding. No hernia.  Musculoskeletal: Normal range of motion. She exhibits no edema, tenderness or deformity.  Lymphadenopathy:    She has no cervical adenopathy.  Neurological: She is alert and oriented to person, place, and time. She displays normal reflexes. No cranial nerve deficit or sensory deficit. She exhibits normal muscle tone. Coordination normal.  Skin: Skin is warm and dry. Capillary refill takes less than 2 seconds. No rash noted. She is not diaphoretic. No erythema. No pallor.  Psychiatric:  She has a normal mood and affect. Her behavior is normal. Judgment and thought content normal.  Nursing note and vitals reviewed.     Assessment & Plan:  1. Routine general medical examination at a health care facility - CBC/BMP were drawn earlier this week. She reports normal readings.  - Lipid panel - TSH  2. Essential hypertension - Well controlled. Continue with lisinopril 2.5 mg   3. Cough - Will repeat chest xray today  - DG Chest 2 View; Future  Shirline Frees, NP

## 2018-07-09 NOTE — Addendum Note (Signed)
Addended by: Conrad BurlingtonAUSTIN, Bryar Dahms on: 07/09/2018 08:53 AM   Modules accepted: Orders

## 2018-07-09 NOTE — Telephone Encounter (Signed)
Updated patient on lab work and chest xray.   She continues to have small area of pneumonia. Will send in doxycycline

## 2018-07-20 ENCOUNTER — Other Ambulatory Visit: Payer: Self-pay | Admitting: Adult Health

## 2018-07-20 NOTE — Telephone Encounter (Signed)
Sent to the pharmacy by e-scribe. 

## 2018-11-26 DIAGNOSIS — Z01419 Encounter for gynecological examination (general) (routine) without abnormal findings: Secondary | ICD-10-CM | POA: Diagnosis not present

## 2018-11-26 DIAGNOSIS — Z6824 Body mass index (BMI) 24.0-24.9, adult: Secondary | ICD-10-CM | POA: Diagnosis not present

## 2019-05-23 ENCOUNTER — Other Ambulatory Visit: Payer: Self-pay | Admitting: Adult Health

## 2019-05-24 ENCOUNTER — Ambulatory Visit (INDEPENDENT_AMBULATORY_CARE_PROVIDER_SITE_OTHER): Payer: 59 | Admitting: Orthopaedic Surgery

## 2019-05-24 ENCOUNTER — Encounter: Payer: Self-pay | Admitting: Orthopaedic Surgery

## 2019-05-24 ENCOUNTER — Ambulatory Visit: Payer: Self-pay

## 2019-05-24 ENCOUNTER — Other Ambulatory Visit: Payer: Self-pay

## 2019-05-24 VITALS — Ht 62.0 in | Wt 130.0 lb

## 2019-05-24 DIAGNOSIS — M549 Dorsalgia, unspecified: Secondary | ICD-10-CM

## 2019-05-24 NOTE — Progress Notes (Signed)
Office Visit Note   Patient: Elizabeth Carr           Date of Birth: June 09, 1981           MRN: [id number] Visit Date: 05/24/2019              Requested by: Shirline Frees, NP [address] [place],  [place] [address] PCP: Shirline Frees, NP   Assessment & Plan: Visit Diagnoses:  1. Mid back pain     Plan: Impression is mid back pain and paraspinal muscle strain and overuse.  We have discussed over-the-counter anti-inflammatories and heat regularly.  Also recommended relative rest.  We have also provided her with an outpatient physical therapy prescription should she not improve on her own.  She will follow up with [place] as needed.  Call with concerns or questions in meantime.  Follow-Up Instructions: Return if symptoms worsen or fail to improve.   Orders:  Orders Placed This Encounter  Procedures  . XR Thoracic Spine 2 View   No orders of the defined types were placed in this encounter.     Procedures: No procedures performed   Clinical Data: No additional findings.   Subjective: Chief Complaint  Patient presents with  . Middle Back - Pain, Numbness    HPI patient is a pleasant 38 year old female who presents to our clinic today with mid back pain and right paraspinous tingling.  She noticed this a few months ago without any known injury or change in activity.  She initially noticed this while hanging up close in the closet.  The pain was intermittent and occurred primarily when hanging up closer bending over to draw blood.  It has become more constant with things such as just walking around or sitting down.  She denies any upper or lower extremity weakness.  No numbness, tingling or burning to either upper or lower extremities.  No history of neck pathology.  She does note that a few years ago she had approximately 18 months of lower back pain which improved with stretching.  Review of Systems as detailed in HPI.  All others reviewed and are negative.    Objective: Vital Signs: Ht 5\' 2"  (1.575 m)   Wt 130 lb (59 kg)   BMI 23.78 kg/m   Physical Exam well-developed well-nourished female no acute distress.  Alert and oriented x3.  Ortho Exam examination of her thoracic spine reveals mild spinous tenderness.  No paraspinous tenderness.  No focal weakness either upper or lower extremity.  Full sensation distally.  Specialty Comments:  No specialty comments available.  Imaging: Xr Thoracic Spine 2 View  Result Date: 05/24/2019 No acute or structural abnormalities    PMFS History: Patient Active Problem List   Diagnosis Date Noted  . Essential hypertension 07/09/2018  . Iron deficiency anemia 06/11/2017  . Hemochromatosis 03/15/2014   Past Medical History:  Diagnosis Date  . Anemia   . Hemochromatosis   . Ovarian cyst   . Pregnancy induced hypertension    with first pregnancy    Family History  Problem Relation Age of Onset  . Hypertension Mother   . Hemochromatosis Mother   . Hypertension Father   . Hypertension Maternal Grandmother   . Hypothyroidism Maternal Grandmother   . Pancreatic cancer Maternal Grandfather   . Hemochromatosis Brother     Past Surgical History:  Procedure Laterality Date  . CERVICAL BIOPSY  W/ LOOP ELECTRODE EXCISION     2010  . CESAREAN SECTION  x 2  . wisdon teeth extraction     Social History   Occupational History  . Not on file  Tobacco Use  . Smoking status: Never Smoker  . Smokeless tobacco: Never Used  Substance and Sexual Activity  . Alcohol use: Yes    Comment: rare  . Drug use: No  . Sexual activity: Not on file

## 2019-05-25 ENCOUNTER — Ambulatory Visit: Payer: 59 | Admitting: Orthopaedic Surgery

## 2019-07-08 ENCOUNTER — Ambulatory Visit (HOSPITAL_COMMUNITY): Payer: 59 | Admitting: Hematology

## 2019-07-13 ENCOUNTER — Other Ambulatory Visit: Payer: Self-pay | Admitting: Adult Health

## 2019-07-13 NOTE — Telephone Encounter (Signed)
Requested medication (s) are due for refill today: yes  Requested medication (s) are on the active medication list: yes  Last refill:  05/23/2019  Future visit scheduled: yes  Notes to clinic:  Review for refill Overdue for office visit  Has upcoming appointment   Requested Prescriptions  Pending Prescriptions Disp Refills   lisinopril (ZESTRIL) 2.5 MG tablet 90 tablet 0    Sig: Take 1 tablet (2.5 mg total) by mouth daily.     Cardiovascular:  ACE Inhibitors Failed - 07/13/2019  1:49 PM      Failed - Cr in normal range and within 180 days    Creatinine, Ser  Date Value Ref Range Status  07/07/2017 0.71 0.40 - 1.20 mg/dL Final         Failed - K in normal range and within 180 days    Potassium  Date Value Ref Range Status  07/07/2017 3.7 3.5 - 5.1 mEq/L Final         Failed - Valid encounter within last 6 months    Recent Outpatient Visits          1 year ago Routine general medical examination at a health care facility   [facility] [place], [place], NP   1 year ago Cough   [facility] Jacqualine Code, MD   1 year ago Essential hypertension   [facility], [place], NP   2 years ago Routine general medical examination at a health care facility   [facility] at [place], [place], NP   3 years ago Routine general medical examination at a health care facility   [facility], [place], [place]            In 2 months Nafziger, Tommi Rumps, NP [facility] at [place], [place] - Patient is not pregnant      Passed - Last BP in normal range    BP Readings from Last 1 Encounters:  07/09/18 106/76

## 2019-07-13 NOTE — Telephone Encounter (Signed)
Medication Refill - Medication: lisinopril (ZESTRIL) 2.5 MG tablet   Has the patient contacted their pharmacy? Yes.   (Agent: If no, request that the patient contact the pharmacy for the refill.) (Agent: If yes, when and what did the pharmacy advise?)  Preferred Pharmacy (with phone number or street name):  [id number] - [place], [place] - [address]  [address] [place] [address]  Phone: [phone] Fax: [phone]    Agent: Please be advised that RX refills may take up to 3 business days. We ask that you follow-up with your pharmacy.

## 2019-07-14 NOTE — Telephone Encounter (Signed)
DENIED.  LAST FILLED ON 05/24/2019 FOR 3 MONTHS.  REQUEST IS TOO EARLY.  MESSAGE SENT TO THE PHARMACY.

## 2019-09-09 ENCOUNTER — Other Ambulatory Visit: Payer: Self-pay | Admitting: Adult Health

## 2019-09-30 ENCOUNTER — Ambulatory Visit (INDEPENDENT_AMBULATORY_CARE_PROVIDER_SITE_OTHER): Payer: 59 | Admitting: Adult Health

## 2019-09-30 ENCOUNTER — Encounter: Payer: Self-pay | Admitting: Adult Health

## 2019-09-30 ENCOUNTER — Other Ambulatory Visit: Payer: Self-pay

## 2019-09-30 VITALS — BP 122/78 | Temp 97.9°F | Ht 62.0 in | Wt 133.0 lb

## 2019-09-30 DIAGNOSIS — D508 Other iron deficiency anemias: Secondary | ICD-10-CM

## 2019-09-30 DIAGNOSIS — I10 Essential (primary) hypertension: Secondary | ICD-10-CM

## 2019-09-30 DIAGNOSIS — Z0000 Encounter for general adult medical examination without abnormal findings: Secondary | ICD-10-CM

## 2019-09-30 LAB — COMPREHENSIVE METABOLIC PANEL
ALT: 24 U/L (ref 0–35)
AST: 15 U/L (ref 0–37)
Albumin: 4 g/dL (ref 3.5–5.2)
Alkaline Phosphatase: 49 U/L (ref 39–117)
BUN: 9 mg/dL (ref 6–23)
CO2: 27 mEq/L (ref 19–32)
Calcium: 9.1 mg/dL (ref 8.4–10.5)
Chloride: 103 mEq/L (ref 96–112)
Creatinine, Ser: 0.75 mg/dL (ref 0.40–1.20)
GFR: 86.35 mL/min (ref >60.00)
Glucose, Bld: 84 mg/dL (ref 70–99)
Potassium: 4.1 mEq/L (ref 3.5–5.1)
Sodium: 136 mEq/L (ref 135–145)
Total Bilirubin: 0.4 mg/dL (ref 0.2–1.2)
Total Protein: 7 g/dL (ref 6.0–8.3)

## 2019-09-30 LAB — CBC WITH DIFFERENTIAL/PLATELET
Basophils Absolute: 0 10*3/uL (ref 0.0–0.1)
Basophils Relative: 0.7 % (ref 0.0–3.0)
Eosinophils Absolute: 0.1 10*3/uL (ref 0.0–0.7)
Eosinophils Relative: 2.4 % (ref 0.0–5.0)
HCT: 33.7 % — ABNORMAL LOW (ref 36.0–46.0)
Hemoglobin: 11.3 g/dL — ABNORMAL LOW (ref 12.0–15.0)
Lymphocytes Relative: 50.6 % — ABNORMAL HIGH (ref 12.0–46.0)
Lymphs Abs: 2.1 10*3/uL (ref 0.7–4.0)
MCHC: 33.4 g/dL (ref 30.0–36.0)
MCV: 84 fl (ref 78.0–100.0)
Monocytes Absolute: 0.4 10*3/uL (ref 0.1–1.0)
Monocytes Relative: 8.4 % (ref 3.0–12.0)
Neutro Abs: 1.6 10*3/uL (ref 1.4–7.7)
Neutrophils Relative %: 37.9 % — ABNORMAL LOW (ref 43.0–77.0)
Platelets: 247 10*3/uL (ref 150.0–400.0)
RBC: 4.02 Mil/uL (ref 3.87–5.11)
RDW: 15.3 % (ref 11.5–15.5)
WBC: 4.2 10*3/uL (ref 4.0–10.5)

## 2019-09-30 LAB — IBC + FERRITIN
Ferritin: 7.4 ng/mL — ABNORMAL LOW (ref 10.0–291.0)
Iron: 12 ug/dL — ABNORMAL LOW (ref 42–145)
Saturation Ratios: 3.3 % — ABNORMAL LOW (ref 20.0–50.0)
Transferrin: 258 mg/dL (ref 212.0–360.0)

## 2019-09-30 LAB — LIPID PANEL
Cholesterol: 177 mg/dL (ref 0–200)
HDL: 74.7 mg/dL (ref >39.00)
LDL Cholesterol: 84 mg/dL (ref 0–99)
NonHDL: 102.66
Total CHOL/HDL Ratio: 2
Triglycerides: 95 mg/dL (ref 0.0–149.0)
VLDL: 19 mg/dL (ref 0.0–40.0)

## 2019-09-30 LAB — TSH: TSH: 1.47 u[IU]/mL (ref 0.35–4.50)

## 2019-09-30 MED ORDER — LISINOPRIL 2.5 MG PO TABS: 2.5000 mg | ORAL_TABLET | Freq: Every day | ORAL | 3 refills | Status: DC

## 2019-09-30 NOTE — Progress Notes (Signed)
Subjective:    Patient ID: Elizabeth Carr, female    DOB: [birth year], 39 y.o.   MRN: [id number]  HPI  Patient presents for yearly preventative medicine examination. She is a pleasant 39 year old female who  has a past medical history of Anemia, Hemochromatosis, Ovarian cyst, and Pregnancy induced hypertension.  Essential Hypertension -she is a strong family history of hypertension.  Currently prescribed lisinopril 2.5 mg.  She denies dizziness, lightheadedness, chest pain, or shortness of breath  Hereditary hemochromatosis -followed by hematology.  She has not required phlebotomies since her iron levels remain below 50 due to blood loss from monthly menstruation.  She would like to be referred to hematology in [place] so that she does not have to drive to [place].  Iron deficiency anemia-iron replacement has not been recommended due to hemochromatosis.  All immunizations and health maintenance protocols were reviewed with the patient and needed orders were placed. UTD on vaccinations   Appropriate screening laboratory values were ordered for the patient including screening of hyperlipidemia, renal function and hepatic function.  Medication reconciliation,  past medical history, social history, problem list and allergies were reviewed in detail with the patient  Goals were established with regard to weight loss, exercise, and  diet in compliance with medications.  He stays active and exercises multiple times per week.  Her diet is heart healthy.  Wt Readings from Last 3 Encounters:  09/30/19 133 lb (60.3 kg)  05/24/19 130 lb (59 kg)  07/09/18 133 lb (60.3 kg)   She has GYN and is up-to-date on routine Pap smears.  No acute issues today   Review of Systems  Constitutional: Negative.   HENT: Negative.   Eyes: Negative.   Respiratory: Negative.   Cardiovascular: Negative.   Gastrointestinal: Negative.   Endocrine: Negative.   Genitourinary: Negative.   Musculoskeletal:  Negative.   Skin: Negative.   Allergic/Immunologic: Negative.   Neurological: Negative.   Hematological: Negative.   Psychiatric/Behavioral: Negative.    Past Medical History:  Diagnosis Date  . Anemia   . Hemochromatosis   . Ovarian cyst   . Pregnancy induced hypertension    with first pregnancy    Social History   Socioeconomic History  . Marital status: Married    Spouse name: Not on file  . Number of children: Not on file  . Years of education: Not on file  . Highest education level: Not on file  Occupational History  . Not on file  Tobacco Use  . Smoking status: Never Smoker  . Smokeless tobacco: Never Used  Substance and Sexual Activity  . Alcohol use: Yes    Comment: rare  . Drug use: No  . Sexual activity: Not on file  Other Topics Concern  . 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 Difficulty of Paying Living Expenses: Not on file  Food Insecurity:   . Worried About Programme researcher, broadcasting/film/video in the Last Year: Not on file  . Ran Out of Food in the Last Year: Not on file  Transportation Needs:   . Lack of Transportation (Medical): Not on file  . Lack of Transportation (Non-Medical): Not on file  Physical Activity:   . Days of Exercise per Week: Not on file  . Minutes  of Exercise per Session: Not on file  Stress:   . Feeling of Stress : Not on file  Social Connections:   . Frequency of Communication with Friends and Family: Not on file  . Frequency of Social Gatherings with Friends and Family: Not on file  . Attends Religious Services: Not on file  . Active Member of Clubs or Organizations: Not on file  . Attends Banker Meetings: Not on file  . Marital Status: Not on file  Intimate Partner Violence:   . Fear of Current or Ex-Partner: Not on file  . Emotionally Abused: Not on file  .  Physically Abused: Not on file  . Sexually Abused: Not on file    Past Surgical History:  Procedure Laterality Date  . CERVICAL BIOPSY  W/ LOOP ELECTRODE EXCISION     2010  . CESAREAN SECTION     x 2  . wisdon teeth extraction      Family History  Problem Relation Age of Onset  . Hypertension Mother   . Hemochromatosis Mother   . Hypertension Father   . Hypertension Maternal Grandmother   . Hypothyroidism Maternal Grandmother   . Pancreatic cancer Maternal Grandfather   . Hemochromatosis Brother     No Known Allergies  Current Outpatient Medications on File Prior to Visit  Medication Sig Dispense Refill  . loratadine (CLARITIN) 10 MG tablet Take 10 mg by mouth daily.    . norgestimate-ethinyl estradiol (ORTHO-CYCLEN,SPRINTEC,PREVIFEM) 0.25-35 MG-MCG tablet Take 1 tablet by mouth daily.     No current facility-administered medications on file prior to visit.    BP 122/78   Temp 97.9 F (36.6 C)   Ht 5\' 2"  (1.575 m) Comment: WITHOUT SHOES  Wt 133 lb (60.3 kg)   BMI 24.33 kg/m       Objective:   Physical Exam Vitals and nursing note reviewed.  Constitutional:      General: She is not in acute distress.    Appearance: Normal appearance. She is well-developed. She is not ill-appearing.  HENT:     Head: Normocephalic and atraumatic.     Right Ear: Tympanic membrane, ear canal and external ear normal. There is no impacted cerumen.     Left Ear: Tympanic membrane, ear canal and external ear normal. There is no impacted cerumen.     Nose: Nose normal. No congestion or rhinorrhea.     Mouth/Throat:     Mouth: Mucous membranes are moist.     Pharynx: Oropharynx is clear. No oropharyngeal exudate or posterior oropharyngeal erythema.  Eyes:     General:        Right eye: No discharge.        Left eye: No discharge.     Extraocular Movements: Extraocular movements intact.     Conjunctiva/sclera: Conjunctivae normal.     Pupils: Pupils are equal, round, and reactive  to light.  Neck:     Thyroid: No thyromegaly.     Vascular: No carotid bruit.     Trachea: No tracheal deviation.  Cardiovascular:     Rate and Rhythm: Normal rate and regular rhythm.     Pulses: Normal pulses.     Heart sounds: Normal heart sounds. No murmur. No friction rub. No gallop.   Pulmonary:     Effort: Pulmonary effort is normal. No respiratory distress.     Breath sounds: Normal breath sounds. No stridor. No wheezing, rhonchi or rales.  Chest:     Chest wall: No tenderness.  Abdominal:     General: Abdomen is flat. Bowel sounds are normal. There is no distension.     Palpations: Abdomen is soft. There is no mass.     Tenderness: There is no abdominal tenderness. There is no right CVA tenderness, left CVA tenderness, guarding or rebound.     Hernia: No hernia is present.  Musculoskeletal:        General: No swelling, tenderness, deformity or signs of injury. Normal range of motion.     Cervical back: Normal range of motion and neck supple.     Right lower leg: No edema.     Left lower leg: No edema.  Lymphadenopathy:     Cervical: No cervical adenopathy.  Skin:    General: Skin is warm and dry.     Coloration: Skin is not jaundiced or pale.     Findings: No bruising, erythema, lesion or rash.  Neurological:     General: No focal deficit present.     Mental Status: She is alert and oriented to person, place, and time.     Cranial Nerves: No cranial nerve deficit.     Sensory: No sensory deficit.     Motor: No weakness.     Coordination: Coordination normal.     Gait: Gait normal.     Deep Tendon Reflexes: Reflexes normal.  Psychiatric:        Mood and Affect: Mood normal.        Behavior: Behavior normal.        Thought Content: Thought content normal.        Judgment: Judgment normal.       Assessment & Plan:  1. Routine general medical examination at a health care facility - Benign exam  - Follow up in one year or sooner if needed - CBC with  Differential/Platelet - Comprehensive metabolic panel - Lipid panel - TSH - IBC + Ferritin  2. Essential hypertension - Continue with lisinopril 2.5 mg - CBC with Differential/Platelet - Comprehensive metabolic panel - Lipid panel - lisinopril (ZESTRIL) 2.5 MG tablet; Take 1 tablet (2.5 mg total) by mouth daily.  Dispense: 90 tablet; Refill: 3  3. Other hemochromatosis  - CBC with Differential/Platelet - Comprehensive metabolic panel - Lipid panel - TSH - IBC + Ferritin - Ambulatory referral to Hematology  4. Other iron deficiency anemia  - CBC with Differential/Platelet - Comprehensive metabolic panel - Lipid panel - TSH - IBC + Ferritin - Ambulatory referral to Hematology   Dorothyann Peng, NP

## 2019-10-03 ENCOUNTER — Telehealth: Payer: Self-pay | Admitting: Adult Health

## 2019-10-03 NOTE — Telephone Encounter (Signed)
Pt is requesting a hematology referral. Pt is looking for a referral to Dr.Peter Ennever at [facility]. Thanks

## 2019-10-04 NOTE — Telephone Encounter (Signed)
Referral has been placed by [facility]  Pt may request Dr. Myna Hidalgo when she is called for an appointment.

## 2019-10-04 NOTE — Telephone Encounter (Signed)
Pt notified to inform office when they call.  Nothing further needed.

## 2019-11-04 ENCOUNTER — Telehealth: Payer: Self-pay | Admitting: Hematology & Oncology

## 2019-11-04 ENCOUNTER — Inpatient Hospital Stay: Payer: 59

## 2019-11-04 ENCOUNTER — Inpatient Hospital Stay: Payer: 59 | Attending: Hematology & Oncology | Admitting: Hematology & Oncology

## 2019-11-04 ENCOUNTER — Other Ambulatory Visit: Payer: Self-pay | Admitting: *Deleted

## 2019-11-04 ENCOUNTER — Other Ambulatory Visit: Payer: Self-pay

## 2019-11-04 DIAGNOSIS — D509 Iron deficiency anemia, unspecified: Secondary | ICD-10-CM

## 2019-11-04 DIAGNOSIS — Z8349 Family history of other endocrine, nutritional and metabolic diseases: Secondary | ICD-10-CM | POA: Diagnosis not present

## 2019-11-04 DIAGNOSIS — Z8249 Family history of ischemic heart disease and other diseases of the circulatory system: Secondary | ICD-10-CM | POA: Insufficient documentation

## 2019-11-04 DIAGNOSIS — Z79899 Other long term (current) drug therapy: Secondary | ICD-10-CM | POA: Diagnosis not present

## 2019-11-04 DIAGNOSIS — Z800 Family history of malignant neoplasm of digestive organs: Secondary | ICD-10-CM | POA: Insufficient documentation

## 2019-11-04 DIAGNOSIS — R5383 Other fatigue: Secondary | ICD-10-CM | POA: Insufficient documentation

## 2019-11-04 LAB — CBC WITH DIFFERENTIAL (CANCER CENTER ONLY)
Abs Immature Granulocytes: 0 10*3/uL (ref 0.00–0.07)
Basophils Absolute: 0 10*3/uL (ref 0.0–0.1)
Basophils Relative: 1 %
Eosinophils Absolute: 0.1 10*3/uL (ref 0.0–0.5)
Eosinophils Relative: 3 %
HCT: 34.5 % — ABNORMAL LOW (ref 36.0–46.0)
Hemoglobin: 10.9 g/dL — ABNORMAL LOW (ref 12.0–15.0)
Immature Granulocytes: 0 %
Lymphocytes Relative: 56 %
Lymphs Abs: 2.1 10*3/uL (ref 0.7–4.0)
MCH: 27.1 pg (ref 26.0–34.0)
MCHC: 31.6 g/dL (ref 30.0–36.0)
MCV: 85.8 fL (ref 80.0–100.0)
Monocytes Absolute: 0.4 10*3/uL (ref 0.1–1.0)
Monocytes Relative: 10 %
Neutro Abs: 1.1 10*3/uL — ABNORMAL LOW (ref 1.7–7.7)
Neutrophils Relative %: 30 %
Platelet Count: 286 10*3/uL (ref 150–400)
RBC: 4.02 MIL/uL (ref 3.87–5.11)
RDW: 14.9 % (ref 11.5–15.5)
WBC Count: 3.8 10*3/uL — ABNORMAL LOW (ref 4.0–10.5)
nRBC: 0 % (ref 0.0–0.2)

## 2019-11-04 LAB — CMP (CANCER CENTER ONLY)
ALT: 27 U/L (ref 0–44)
AST: 23 U/L (ref 15–41)
Albumin: 4.2 g/dL (ref 3.5–5.0)
Alkaline Phosphatase: 57 U/L (ref 38–126)
Anion gap: 6 (ref 5–15)
BUN: 7 mg/dL (ref 6–20)
CO2: 28 mmol/L (ref 22–32)
Calcium: 9.6 mg/dL (ref 8.9–10.3)
Chloride: 103 mmol/L (ref 98–111)
Creatinine: 0.74 mg/dL (ref 0.44–1.00)
GFR, Est AFR Am: >60 mL/min (ref >60)
GFR, Estimated: >60 mL/min (ref >60)
Glucose, Bld: 98 mg/dL (ref 70–99)
Potassium: 4.3 mmol/L (ref 3.5–5.1)
Sodium: 137 mmol/L (ref 135–145)
Total Bilirubin: 0.5 mg/dL (ref 0.3–1.2)
Total Protein: 7.1 g/dL (ref 6.5–8.1)

## 2019-11-04 NOTE — Telephone Encounter (Signed)
Appointments scheduled patient has My Chart for appt info Per 4/2 los

## 2019-11-04 NOTE — Progress Notes (Signed)
Referral MD  Reason for Referral: Hemochromatosis  Chief Complaint  Patient presents with  . New Patient (Initial Visit)  : I have hemochromatosis.  My hematologist retired.  HPI: [name] is a very nice and petite 39 year old Caucasian female.  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  She has known hemochromatosis.  She has been followed by the very highly esteemed Dr. Mariel Sleet.  To no surprise, she has had incredibly compassionate and thorough care from Dr. Mariel Sleet.  She basically "auto phlebotomizes" herself monthly with her cycles.  Her mother has hemochromatosis.  Her brother has hemochromatosis.  She has had no problems with fatigue or weakness.  She has had no joint problems.  She has had no cough or shortness of breath.  She does not drink alcohol or smoke cigarettes.  She has a 39 year old and 21-year-old.  They are both doing well and healthy.  She has had no issues with fever.  There is no cough or shortness of breath.  She has had no change in bowel or bladder habits.  There is been no rashes.  She has seen hematology yearly.  She just wanted to make sure she had follow-up.  Currently, her performance status is ECOG 0.   Past Medical History:  Diagnosis Date  . Anemia   . Hemochromatosis   . Ovarian cyst   . Pregnancy induced hypertension    with first pregnancy  :  Past Surgical History:  Procedure Laterality Date  . CERVICAL BIOPSY  W/ LOOP ELECTRODE EXCISION     2010  . CESAREAN SECTION     x 2  . wisdon teeth extraction    :   Current Outpatient Medications:  .  lisinopril (ZESTRIL) 2.5 MG tablet, Take 1 tablet (2.5 mg total) by mouth daily., Disp: 90 tablet, Rfl: 3 .  loratadine (CLARITIN) 10 MG tablet, Take 10 mg by mouth daily as needed. , Disp: , Rfl: :  :  No Known Allergies:  Family History  Problem Relation Age of Onset  . Hypertension Mother   . Hemochromatosis Mother   . Hypertension Father    . Hypertension Maternal Grandmother   . Hypothyroidism Maternal Grandmother   . Pancreatic cancer Maternal Grandfather   . Hemochromatosis Brother   :  Social History   Socioeconomic History  . Marital status: Married    Spouse name: Not on file  . Number of children: Not on file  . Years of education: Not on file  . Highest education level: Not on file  Occupational History  . Not on file  Tobacco Use  . Smoking status: Never Smoker  . Smokeless tobacco: Never Used  Substance and Sexual Activity  . Alcohol use: Yes    Comment: rare  . Drug use: No  . Sexual activity: Not on file  Other Topics Concern  . 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 Difficulty of Paying Living Expenses:   Food Insecurity:   . Worried About Programme researcher, broadcasting/film/video in the Last Year:   . Barista in the Last Year:   Transportation Needs:   . Freight forwarder (Medical):   Marland Kitchen Lack of Transportation (Non-Medical):   Physical Activity:   .  Days of Exercise per Week:   . Minutes of Exercise per Session:   Stress:   . Feeling of Stress :   Social Connections:   . Frequency of Communication with Friends and Family:   . Frequency of Social Gatherings with Friends and Family:   . Attends Religious Services:   . Active Member of Clubs or Organizations:   . Attends Banker Meetings:   Marland Kitchen Marital Status:   Intimate Partner Violence:   . Fear of Current or Ex-Partner:   . Emotionally Abused:   Marland Kitchen Physically Abused:   . Sexually Abused:   :  Review of Systems  Constitutional: Negative.   HENT: Negative.   Eyes: Negative.   Respiratory: Negative.   Cardiovascular: Negative.   Gastrointestinal: Negative.   Genitourinary: Negative.   Musculoskeletal: Negative.   Skin: Negative.   Neurological: Negative.    Endo/Heme/Allergies: Negative.   Psychiatric/Behavioral: Negative.      Exam:      Physical Exam Vitals reviewed.  HENT:     Head: Normocephalic and atraumatic.  Eyes:     Pupils: Pupils are equal, round, and reactive to light.  Cardiovascular:     Rate and Rhythm: Normal rate and regular rhythm.     Heart sounds: Normal heart sounds.  Pulmonary:     Effort: Pulmonary effort is normal.     Breath sounds: Normal breath sounds.  Abdominal:     General: Bowel sounds are normal.     Palpations: Abdomen is soft.  Musculoskeletal:        General: No tenderness or deformity. Normal range of motion.     Cervical back: Normal range of motion.  Lymphadenopathy:     Cervical: No cervical adenopathy.  Skin:    General: Skin is warm and dry.     Findings: No erythema or rash.  Neurological:     Mental Status: She is alert and oriented to person, place, and time.  Psychiatric:        Behavior: Behavior normal.        Thought Content: Thought content normal.        Judgment: Judgment normal.    @IPVITALS @   Recent Labs    11/04/19 1103  WBC 3.8*  HGB 10.9*  HCT 34.5*  PLT 286   Recent Labs    11/04/19 1103  NA 137  K 4.3  CL 103  CO2 28  GLUCOSE 98  BUN 7  CREATININE 0.74  CALCIUM 9.6    Blood smear review: Relatively benign  Pathology: None    Assessment and Plan: [name] is a very charming 39 year old Caucasian female.  She has hemochromatosis.  I really need to find out what type of hemochromatosis she has.  I need to see what the genetic mutation is.  I would not imagine that she will have problems with the hemochromatosis as long she is having her monthly cycles.  Typically, women did not start having problems with iron overload from hemochromatosis until they are about 10 years out from menopause.  I know she is a little anemic.  This really does not surprise me.  Again she has her monthly cycles.  She is not symptomatic with this.  I told her that  if she did run into problems with more fatigue and less stamina from anemia, we could always give her iron back even though she has hemochromatosis.  We have done this with other patients and it has helped with her  quality of life.  She is a Marine scientist.  She obviously is incredibly well versed on hemochromatosis.  She still feels that yearly follow-up is appropriate.  We will maintain yearly follow-up.  She can was come back sooner if she has any problems.  I spent about 45 minutes with her.  She was a lot of fun to talk to.

## 2019-11-07 LAB — IRON AND TIBC
Iron: 27 ug/dL — ABNORMAL LOW (ref 41–142)
Saturation Ratios: 8 % — ABNORMAL LOW (ref 21–57)
TIBC: 320 ug/dL (ref 236–444)
UIBC: 293 ug/dL (ref 120–384)

## 2019-11-07 LAB — FERRITIN: Ferritin: <4 ng/mL — ABNORMAL LOW (ref 11–307)

## 2019-11-08 ENCOUNTER — Telehealth: Payer: Self-pay | Admitting: *Deleted

## 2019-11-08 NOTE — Telephone Encounter (Signed)
Message left to notify pt per order of Dr. Myna Hidalgo that "the iron is nice and low!!!  No need for a phlebotomy.  Pete"  Instructed pt to call office back with any questions or concerns.

## 2019-11-08 NOTE — Telephone Encounter (Signed)
-----   Message from Josph Macho, MD sent at 11/08/2019  6:46 AM EDT ----- Call - the iron is nice and low!!!  No need for a phlebotomy.  Cindee Lame

## 2019-11-09 LAB — HEMOCHROMATOSIS DNA-PCR(C282Y,H63D)

## 2019-11-09 IMAGING — DX DG LUMBAR SPINE COMPLETE 4+V
5 series · 5 of 5 positions shown · non-contrast
Comparison: None.

CLINICAL DATA: Chronic pain for 9 months.

EXAM:
LUMBAR SPINE - COMPLETE 4+ VIEW

[l-spine ap]
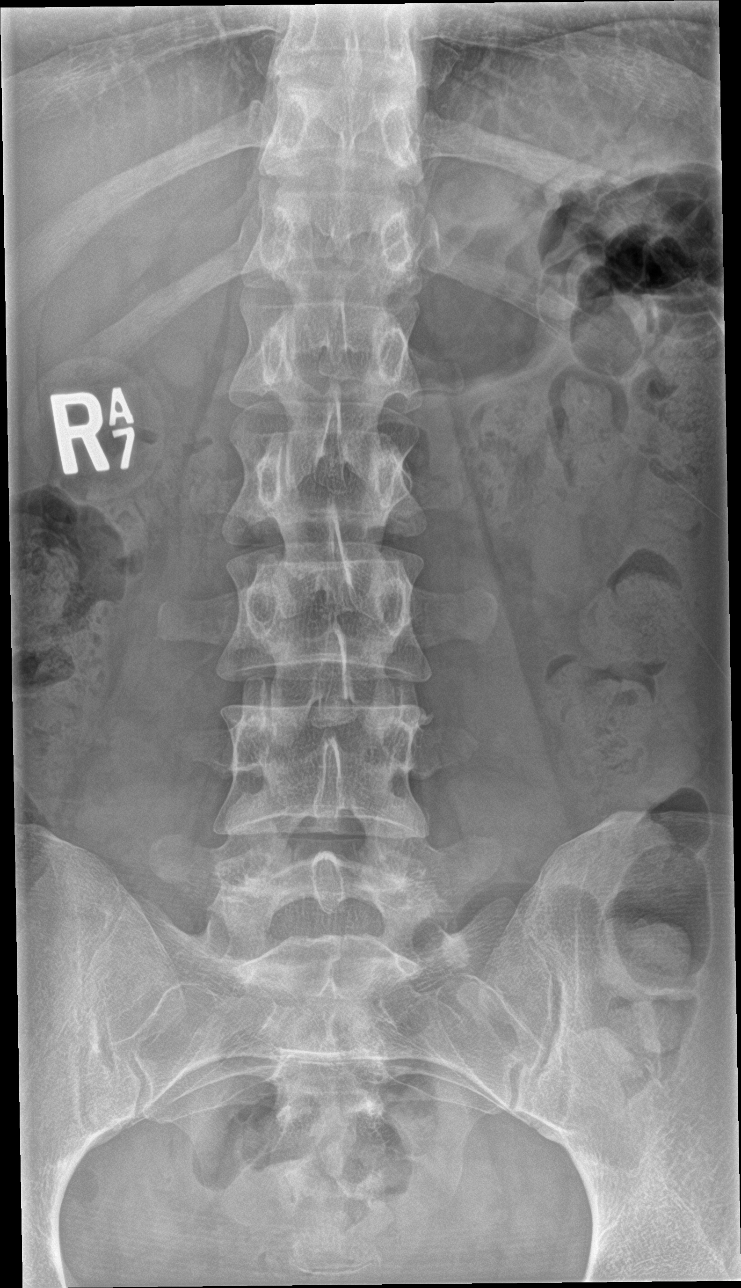

[l-spine obl (1 of 2)]
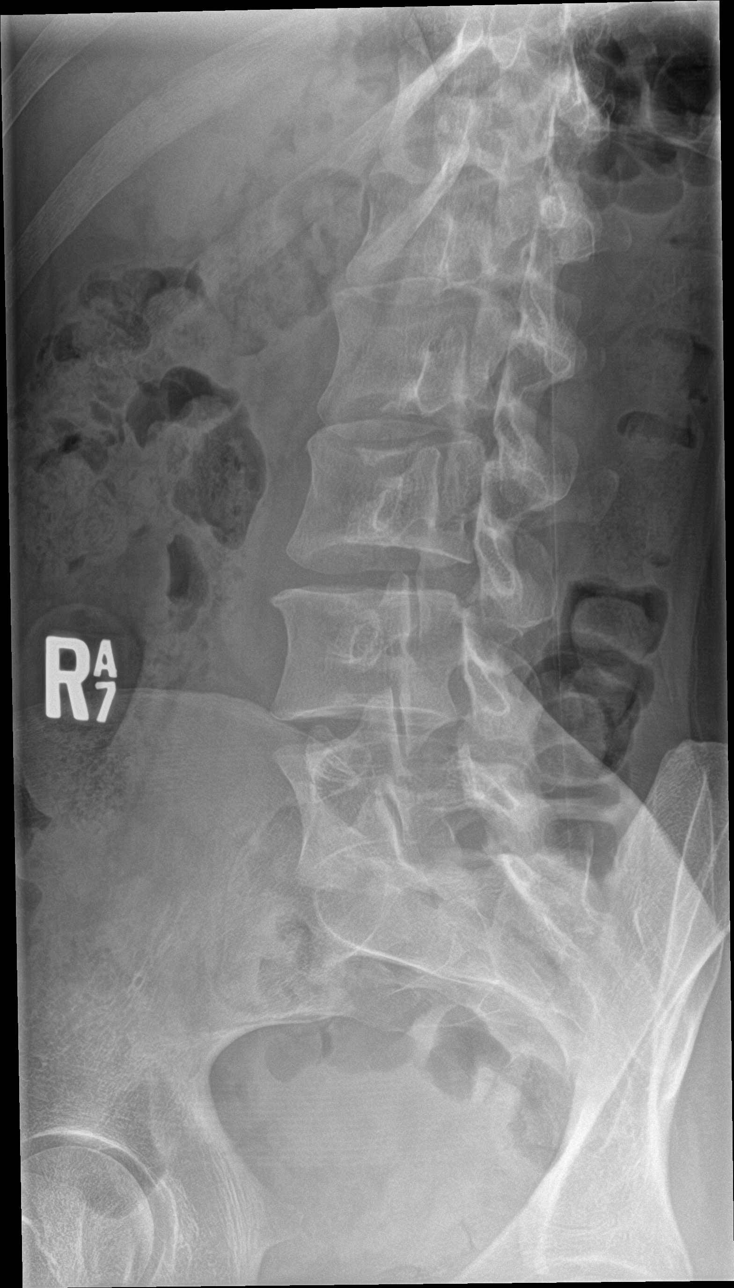

[l-spine obl (2 of 2)]
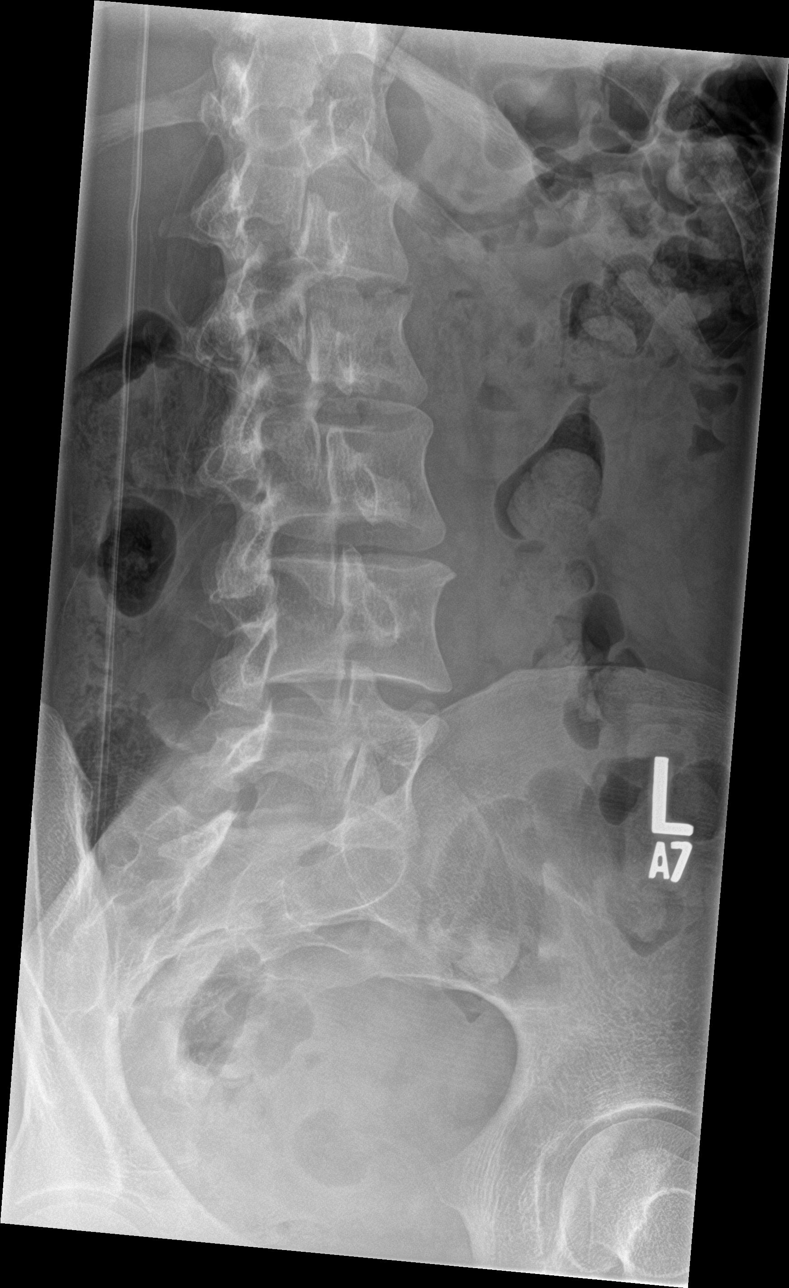

[l-spine lat]
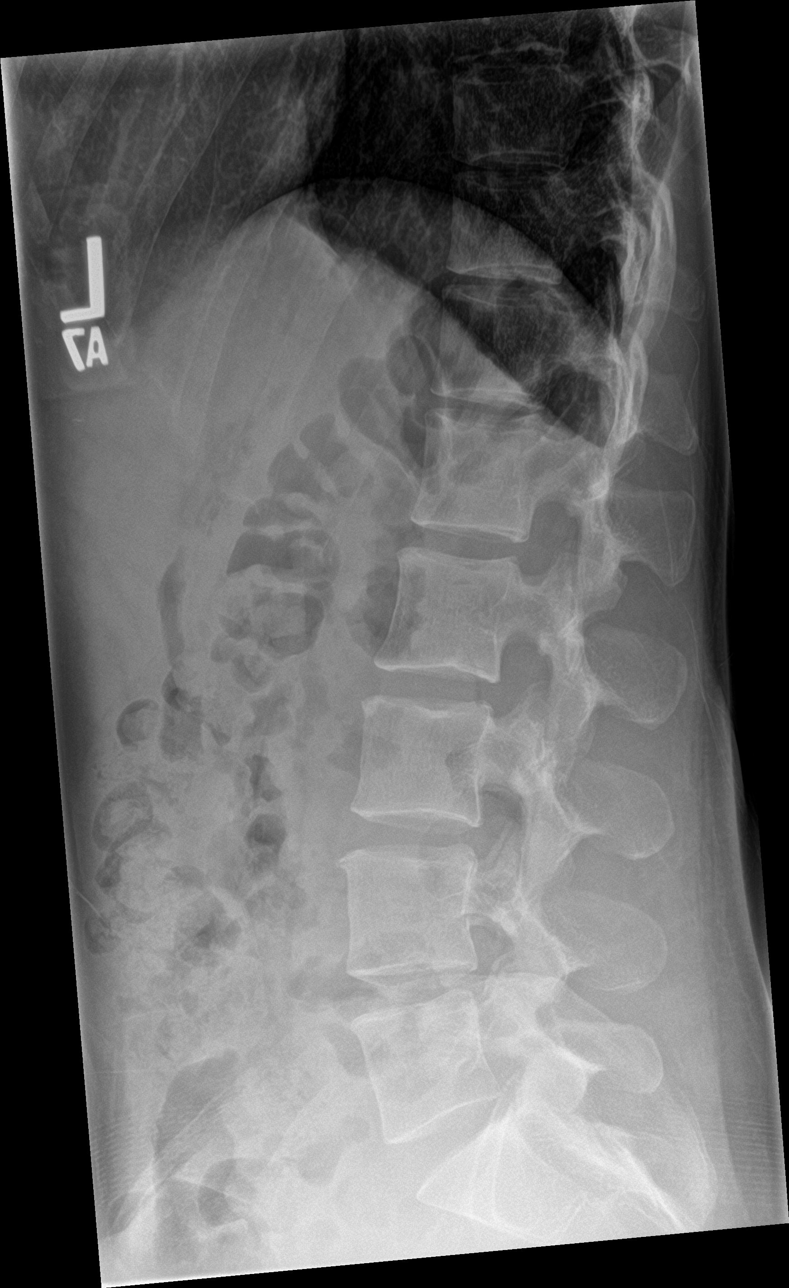

[l-spine spot]
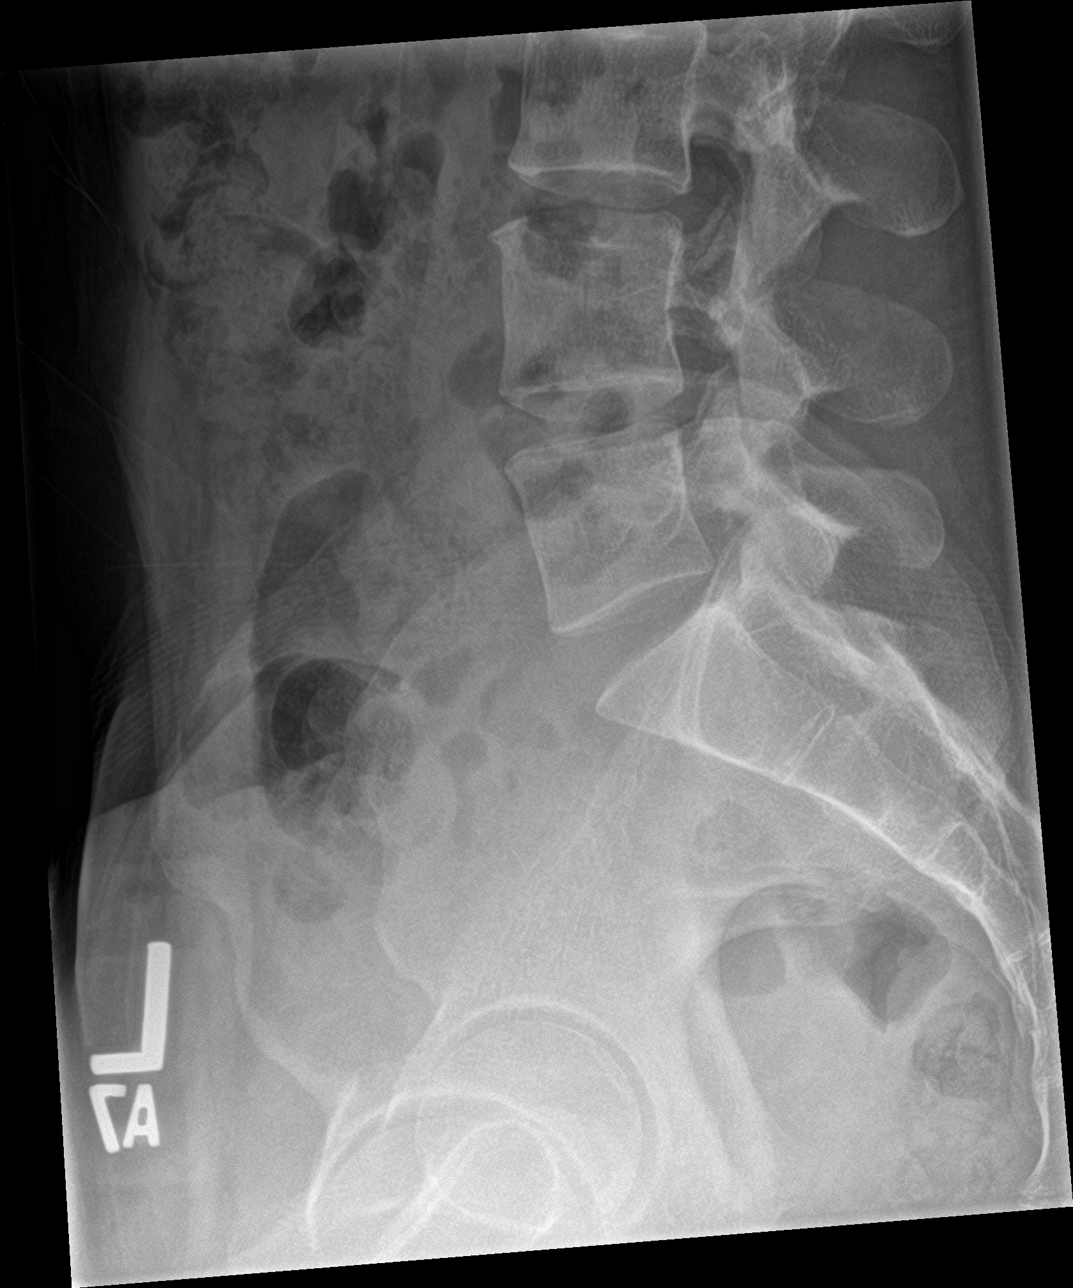

[5 of 5 positions shown; findings below may reference images not displayed]

FINDINGS: There is no evidence of lumbar spine fracture. Alignment is normal.
Intervertebral disc spaces are maintained.
IMPRESSION: Negative.

## 2020-10-02 ENCOUNTER — Ambulatory Visit (INDEPENDENT_AMBULATORY_CARE_PROVIDER_SITE_OTHER): Payer: 59 | Admitting: Adult Health

## 2020-10-02 ENCOUNTER — Other Ambulatory Visit: Payer: Self-pay

## 2020-10-02 ENCOUNTER — Encounter: Payer: Self-pay | Admitting: Adult Health

## 2020-10-02 VITALS — BP 115/80 | HR 68 | Ht 61.0 in | Wt 133.6 lb

## 2020-10-02 DIAGNOSIS — Z0000 Encounter for general adult medical examination without abnormal findings: Secondary | ICD-10-CM | POA: Diagnosis not present

## 2020-10-02 DIAGNOSIS — Z1159 Encounter for screening for other viral diseases: Secondary | ICD-10-CM

## 2020-10-02 DIAGNOSIS — I10 Essential (primary) hypertension: Secondary | ICD-10-CM | POA: Diagnosis not present

## 2020-10-02 LAB — LIPID PANEL
Cholesterol: 173 mg/dL (ref 0–200)
HDL: 82.4 mg/dL (ref >39.00)
LDL Cholesterol: 76 mg/dL (ref 0–99)
NonHDL: 90.92
Total CHOL/HDL Ratio: 2
Triglycerides: 74 mg/dL (ref 0.0–149.0)
VLDL: 14.8 mg/dL (ref 0.0–40.0)

## 2020-10-02 LAB — COMPREHENSIVE METABOLIC PANEL
ALT: 11 U/L (ref 0–35)
AST: 14 U/L (ref 0–37)
Albumin: 4.1 g/dL (ref 3.5–5.2)
Alkaline Phosphatase: 64 U/L (ref 39–117)
BUN: 8 mg/dL (ref 6–23)
CO2: 28 mEq/L (ref 19–32)
Calcium: 9.2 mg/dL (ref 8.4–10.5)
Chloride: 104 mEq/L (ref 96–112)
Creatinine, Ser: 0.76 mg/dL (ref 0.40–1.20)
GFR: 98.8 mL/min (ref >60.00)
Glucose, Bld: 89 mg/dL (ref 70–99)
Potassium: 3.9 mEq/L (ref 3.5–5.1)
Sodium: 136 mEq/L (ref 135–145)
Total Bilirubin: 0.4 mg/dL (ref 0.2–1.2)
Total Protein: 6.9 g/dL (ref 6.0–8.3)

## 2020-10-02 LAB — CBC WITH DIFFERENTIAL/PLATELET
Basophils Absolute: 0 10*3/uL (ref 0.0–0.1)
Basophils Relative: 0.7 % (ref 0.0–3.0)
Eosinophils Absolute: 0.1 10*3/uL (ref 0.0–0.7)
Eosinophils Relative: 4.7 % (ref 0.0–5.0)
HCT: 30.7 % — ABNORMAL LOW (ref 36.0–46.0)
Hemoglobin: 10.3 g/dL — ABNORMAL LOW (ref 12.0–15.0)
Lymphocytes Relative: 47.4 % — ABNORMAL HIGH (ref 12.0–46.0)
Lymphs Abs: 1.5 10*3/uL (ref 0.7–4.0)
MCHC: 33.7 g/dL (ref 30.0–36.0)
MCV: 80.1 fl (ref 78.0–100.0)
Monocytes Absolute: 0.3 10*3/uL (ref 0.1–1.0)
Monocytes Relative: 10.1 % (ref 3.0–12.0)
Neutro Abs: 1.1 10*3/uL — ABNORMAL LOW (ref 1.4–7.7)
Neutrophils Relative %: 37.1 % — ABNORMAL LOW (ref 43.0–77.0)
Platelets: 249 10*3/uL (ref 150.0–400.0)
RBC: 3.83 Mil/uL — ABNORMAL LOW (ref 3.87–5.11)
RDW: 16.2 % — ABNORMAL HIGH (ref 11.5–15.5)
WBC: 3.1 10*3/uL — ABNORMAL LOW (ref 4.0–10.5)

## 2020-10-02 LAB — TSH: TSH: 2.07 u[IU]/mL (ref 0.35–4.50)

## 2020-10-02 MED ORDER — LISINOPRIL 2.5 MG PO TABS
2.5000 mg | ORAL_TABLET | Freq: Every day | ORAL | 3 refills | Status: DC
Start: 2020-10-02 — End: 2021-10-04

## 2020-10-02 NOTE — Patient Instructions (Signed)
It was great seeing you today!   We will follow up with you about your blood work   I will see you back in one year or sooner if needed

## 2020-10-02 NOTE — Progress Notes (Signed)
Subjective:    Patient ID: Elizabeth Carr, female    DOB: 1981-04-29, 40 y.o.   MRN: [id number]  HPI  Patient presents for yearly preventative medicine examination. She is a pleasant 40 year old female who  has a past medical history of Anemia, Hemochromatosis, Ovarian cyst, and Pregnancy induced hypertension.  Essential Hypertension -has a strong family history of hypertension. Currently prescribed lisinopril 2.5 mg daily and has been well controlled on this in the past. She denies dizziness, lightheadedness, chest pain, or shortness of breath   BP Readings from Last 3 Encounters:  10/02/20 115/80  11/04/19 125/70  09/30/19 122/78    Hereditary hemochromatosis-followed by hematology. Has not required any recent phlebotomy since her iron levels remain below 50 due to blood loss from monthly menstruation. She denies fatigue, weakness, shortness of breath, chest pain, palpitations, or joint problems.   All immunizations and health maintenance protocols were reviewed with the patient and needed orders were placed.  Appropriate screening laboratory values were ordered for the patient including screening of hyperlipidemia, renal function and hepatic function.  Medication reconciliation,  past medical history, social history, problem list and allergies were reviewed in detail with the patient  Goals were established with regard to weight loss, exercise, and  diet in compliance with medications. She stays active and enjoys exercising and eating healthy   She is due in May for GYN.   Review of Systems  Constitutional: Negative.   HENT: Negative.   Eyes: Negative.   Respiratory: Negative.   Cardiovascular: Negative.   Gastrointestinal: Negative.   Endocrine: Negative.   Genitourinary: Negative.   Musculoskeletal: Negative.   Skin: Negative.   Allergic/Immunologic: Negative.   Neurological: Negative.   Hematological: Negative.   Psychiatric/Behavioral: Negative.    Past  Medical History:  Diagnosis Date  . Anemia   . Hemochromatosis   . Ovarian cyst   . Pregnancy induced hypertension    with first pregnancy    Social History   Socioeconomic History  . Marital status: Married    Spouse name: Not on file  . Number of children: Not on file  . Years of education: Not on file  . Highest education level: Not on file  Occupational History  . Not on file  Tobacco Use  . Smoking status: Never Smoker  . Smokeless tobacco: Never Used  Substance and Sexual Activity  . Alcohol use: Yes    Comment: rare  . Drug use: No  . Sexual activity: Not on file  Other Topics Concern  . 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 CERVICAL BIOPSY  W/ LOOP ELECTRODE EXCISION     2010  . CESAREAN SECTION     x 2  . wisdon teeth extraction      Family History  Problem Relation Age of Onset  . Hypertension Mother   . Hemochromatosis Mother   . Hypertension Father   . Hypertension Maternal Grandmother   . Hypothyroidism Maternal Grandmother   .  Pancreatic cancer Maternal Grandfather   . Hemochromatosis Brother     No Known Allergies  Current Outpatient Medications on File Prior to Visit  Medication Sig Dispense Refill  . lisinopril (ZESTRIL) 2.5 MG tablet Take 1 tablet (2.5 mg total) by mouth daily. 90 tablet 3  . loratadine (CLARITIN) 10 MG tablet Take 10 mg by mouth daily as needed.      No current facility-administered medications on file prior to visit.    There were no vitals taken for this visit.      Objective:   Physical  Exam Vitals and nursing note reviewed.  Constitutional:      General: She is not in acute distress.    Appearance: Normal appearance. She is well-developed. She is not ill-appearing.  HENT:     Head: Normocephalic and atraumatic.     Right Ear: Tympanic membrane, ear canal and external ear normal. There is no impacted cerumen.     Left Ear: Tympanic membrane, ear canal and external ear normal. There is no impacted cerumen.     Nose: Nose normal. No congestion or rhinorrhea.     Mouth/Throat:     Mouth: Mucous membranes are moist.     Pharynx: Oropharynx is clear. No oropharyngeal exudate or posterior oropharyngeal erythema.  Eyes:     General:        Right eye: No discharge.        Left eye: No discharge.     Extraocular Movements: Extraocular movements intact.     Conjunctiva/sclera: Conjunctivae normal.     Pupils: Pupils are equal, round, and reactive to light.  Neck:     Thyroid: No thyromegaly.     Vascular: No carotid bruit.     Trachea: No tracheal deviation.  Cardiovascular:     Rate and Rhythm: Normal rate and regular rhythm.     Pulses: Normal pulses.     Heart sounds: Normal heart sounds. No murmur heard. No friction rub. No gallop.   Pulmonary:     Effort: Pulmonary effort is normal. No respiratory distress.     Breath sounds: Normal breath sounds. No stridor. No wheezing, rhonchi or rales.  Chest:     Chest wall: No tenderness.  Abdominal:     General: Abdomen is flat. Bowel sounds are normal. There is no distension.     Palpations: Abdomen is soft. There is no mass.     Tenderness: There is no abdominal tenderness. There is no right CVA tenderness, left CVA tenderness, guarding or rebound.     Hernia: No hernia is present.  Musculoskeletal:        General: No swelling, tenderness, deformity or signs of injury. Normal range of motion.     Cervical back: Normal range of motion and neck supple.     Right lower leg: No edema.     Left lower leg: No edema.   Lymphadenopathy:     Cervical: No cervical adenopathy.  Skin:    General: Skin is warm and dry.     Coloration: Skin is not jaundiced or pale.     Findings: No bruising, erythema, lesion or rash.  Neurological:     General: No focal deficit present.     Mental Status: She is alert and oriented to person, place, and time.     Cranial Nerves: No cranial nerve deficit.     Sensory: No sensory deficit.     Motor: No weakness.     Coordination: Coordination normal.  Gait: Gait normal.     Deep Tendon Reflexes: Reflexes normal.  Psychiatric:        Mood and Affect: Mood normal.        Behavior: Behavior normal.        Thought Content: Thought content normal.        Judgment: Judgment normal.       Assessment & Plan:  1. Routine general medical examination at a health care facility - Follow up in one year or sooner if needed - Benign exam  - CBC with Differential/Platelet; Future - Comprehensive metabolic panel; Future - Lipid panel; Future - TSH; Future  2. Essential hypertension - Well controlled. No change in medication. Continue to with lisinopril 2.5 mg  - CBC with Differential/Platelet; Future - Comprehensive metabolic panel; Future - Lipid panel; Future - TSH; Future - lisinopril (ZESTRIL) 2.5 MG tablet; Take 1 tablet (2.5 mg total) by mouth daily.  Dispense: 90 tablet; Refill: 3  3. Other hemochromatosis - Follow up with Hematology as directed - CBC with Differential/Platelet; Future - Comprehensive metabolic panel; Future - Lipid panel; Future - TSH; Future  4. Need for hepatitis C screening test  - Hep C Antibody; Future  Shirline Frees, NP

## 2020-10-02 NOTE — Addendum Note (Signed)
Addended by: Evert Kohl D on: 10/02/2020 08:32 AM   Modules accepted: Orders

## 2020-10-03 LAB — HEPATITIS C ANTIBODY
Hepatitis C Ab: NONREACTIVE
SIGNAL TO CUT-OFF: 0.01 (ref <1.00)

## 2020-11-02 ENCOUNTER — Inpatient Hospital Stay: Payer: 59 | Admitting: Hematology & Oncology

## 2020-11-02 ENCOUNTER — Encounter: Payer: Self-pay | Admitting: Hematology & Oncology

## 2020-11-02 ENCOUNTER — Inpatient Hospital Stay: Payer: 59 | Attending: Hematology & Oncology

## 2020-11-02 ENCOUNTER — Other Ambulatory Visit: Payer: Self-pay

## 2020-11-02 DIAGNOSIS — Z79899 Other long term (current) drug therapy: Secondary | ICD-10-CM | POA: Insufficient documentation

## 2020-11-02 DIAGNOSIS — R0981 Nasal congestion: Secondary | ICD-10-CM | POA: Insufficient documentation

## 2020-11-02 LAB — CBC WITH DIFFERENTIAL (CANCER CENTER ONLY)
Abs Immature Granulocytes: 0.01 10*3/uL (ref 0.00–0.07)
Basophils Absolute: 0 10*3/uL (ref 0.0–0.1)
Basophils Relative: 1 %
Eosinophils Absolute: 0.2 10*3/uL (ref 0.0–0.5)
Eosinophils Relative: 4 %
HCT: 31.7 % — ABNORMAL LOW (ref 36.0–46.0)
Hemoglobin: 10.1 g/dL — ABNORMAL LOW (ref 12.0–15.0)
Immature Granulocytes: 0 %
Lymphocytes Relative: 48 %
Lymphs Abs: 2 10*3/uL (ref 0.7–4.0)
MCH: 26.1 pg (ref 26.0–34.0)
MCHC: 31.9 g/dL (ref 30.0–36.0)
MCV: 81.9 fL (ref 80.0–100.0)
Monocytes Absolute: 0.4 10*3/uL (ref 0.1–1.0)
Monocytes Relative: 9 %
Neutro Abs: 1.6 10*3/uL — ABNORMAL LOW (ref 1.7–7.7)
Neutrophils Relative %: 38 %
Platelet Count: 286 10*3/uL (ref 150–400)
RBC: 3.87 MIL/uL (ref 3.87–5.11)
RDW: 15.1 % (ref 11.5–15.5)
WBC Count: 4.2 10*3/uL (ref 4.0–10.5)
nRBC: 0 % (ref 0.0–0.2)

## 2020-11-02 LAB — CMP (CANCER CENTER ONLY)
ALT: 19 U/L (ref 0–44)
AST: 21 U/L (ref 15–41)
Albumin: 4.3 g/dL (ref 3.5–5.0)
Alkaline Phosphatase: 65 U/L (ref 38–126)
Anion gap: 5 (ref 5–15)
BUN: 9 mg/dL (ref 6–20)
CO2: 30 mmol/L (ref 22–32)
Calcium: 9.3 mg/dL (ref 8.9–10.3)
Chloride: 103 mmol/L (ref 98–111)
Creatinine: 0.76 mg/dL (ref 0.44–1.00)
GFR, Estimated: >60 mL/min (ref >60)
Glucose, Bld: 95 mg/dL (ref 70–99)
Potassium: 3.5 mmol/L (ref 3.5–5.1)
Sodium: 138 mmol/L (ref 135–145)
Total Bilirubin: 0.4 mg/dL (ref 0.3–1.2)
Total Protein: 7.2 g/dL (ref 6.5–8.1)

## 2020-11-02 LAB — IRON AND TIBC
Iron: 29 ug/dL — ABNORMAL LOW (ref 41–142)
Saturation Ratios: 9 % — ABNORMAL LOW (ref 21–57)
TIBC: 338 ug/dL (ref 236–444)
UIBC: 309 ug/dL (ref 120–384)

## 2020-11-02 LAB — RETICULOCYTES
Immature Retic Fract: 15.9 % (ref 2.3–15.9)
RBC.: 3.87 MIL/uL (ref 3.87–5.11)
Retic Count, Absolute: 41.4 10*3/uL (ref 19.0–186.0)
Retic Ct Pct: 1.1 % (ref 0.4–3.1)

## 2020-11-02 LAB — FERRITIN: Ferritin: 4 ng/mL — ABNORMAL LOW (ref 11–307)

## 2020-11-02 NOTE — Progress Notes (Signed)
Hematology and Oncology Follow Up Visit  Elizabeth Carr [id number] November 08, 1980 40 y.o. 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  We see her yearly.  She has had no problems since we last saw her.  She still working for a Investment banker, operational.  She really enjoys this.  She and her family were down at [employer] back in January.  She actually had a Covid before they went down to [employer].  She had some congestion.  When we last saw her, her ferritin was less than 4 and her iron saturation was 8%.  She really does not feel fatigued.  She has had monthly cycles.  They are fairly regular.  She has had no problems with nausea or vomiting.  She has had no pain in the hands or feet.  She has had no cough or shortness of breath.  There has been no change in bowel or bladder habits.  Overall, her performance status is ECOG 0.  Medications:  Current Outpatient Medications:  .  lisinopril (ZESTRIL) 2.5 MG tablet, Take 1 tablet (2.5 mg total) by mouth daily., Disp: 90 tablet, Rfl: 3 .  loratadine (CLARITIN) 10 MG tablet, Take 10 mg by mouth daily as needed. , Disp: , Rfl:   Allergies: No Known Allergies  Past Medical History, Surgical history, Social history, and Family History were reviewed and updated.  Review of Systems: Review of Systems  Constitutional: Negative.   HENT:  Negative.   Eyes: Negative.   Respiratory: Negative.   Cardiovascular: Negative.   Gastrointestinal: Negative.   Endocrine: Negative.   Genitourinary: Negative.    Musculoskeletal: Negative.   Skin: Negative.   Neurological: Negative.   Hematological: Negative.   Psychiatric/Behavioral: Negative.     Physical Exam:  weight is 134 lb (60.8 kg). Her oral temperature is 98.4 F (36.9 C). Her blood  pressure is 110/80 and her pulse is 54 (abnormal). Her respiration is 16 and oxygen saturation is 100%.   Wt Readings from Last 3 Encounters:  11/02/20 134 lb (60.8 kg)  10/02/20 133 lb 9.6 oz (60.6 kg)  11/04/19 134 lb (60.8 kg)    Physical Exam Vitals reviewed.  HENT:     Head: Normocephalic and atraumatic.  Eyes:     Pupils: Pupils are equal, round, and reactive to light.  Cardiovascular:     Rate and Rhythm: Normal rate and regular rhythm.     Heart sounds: Normal heart sounds.  Pulmonary:     Effort: Pulmonary effort is normal.     Breath sounds: Normal breath sounds.  Abdominal:     General: Bowel sounds are normal.     Palpations: Abdomen is soft.  Musculoskeletal:        General: No tenderness or deformity. Normal range of motion.     Cervical back: Normal range of motion.  Lymphadenopathy:     Cervical: No cervical adenopathy.  Skin:    General: Skin is warm and dry.     Findings: No erythema or rash.  Neurological:     Mental Status: She is alert and oriented to person, place, and time.  Psychiatric:        Behavior: Behavior normal.        Thought Content: Thought content normal.  Judgment: Judgment normal.     Lab Results  Component Value Date   WBC 4.2 11/02/2020   HGB 10.1 (L) 11/02/2020   HCT 31.7 (L) 11/02/2020   MCV 81.9 11/02/2020   PLT 286 11/02/2020     Chemistry      Component Value Date/Time   NA 138 11/02/2020 0753   K 3.5 11/02/2020 0753   CL 103 11/02/2020 0753   CO2 30 11/02/2020 0753   BUN 9 11/02/2020 0753   CREATININE 0.76 11/02/2020 0753      Component Value Date/Time   CALCIUM 9.3 11/02/2020 0753   ALKPHOS 65 11/02/2020 0753   AST 21 11/02/2020 0753   ALT 19 11/02/2020 0753   BILITOT 0.4 11/02/2020 0753       Impression and Plan: Elizabeth Carr is a very nice 40 year old white female.  She is homozygous for the major mutation.  However, iron overload has never been a problem for her.  In fact, she may have problems  with low iron.  Her hemoglobin is dropping slowly.  I am sure this is from her monthly cycles.  I told her that she actually wanted to take some iron supplements or take iron with multivitamins, she probably could and not cause any problems.  We will see what her iron levels are today.  As always, we will get her back in 1 year.  I told her to make sure she wears sunscreen and drinks a lot of water when she goes on vacation this year and when she is at the softball and baseball games of her family.   Josph Macho, MD 4/1/20228:47 AM

## 2021-10-04 ENCOUNTER — Encounter: Payer: Self-pay | Admitting: Adult Health

## 2021-10-04 ENCOUNTER — Ambulatory Visit (INDEPENDENT_AMBULATORY_CARE_PROVIDER_SITE_OTHER): Payer: 59 | Admitting: Adult Health

## 2021-10-04 VITALS — BP 110/80 | HR 60 | Temp 98.5°F | Ht 62.0 in | Wt 127.0 lb

## 2021-10-04 DIAGNOSIS — Z0000 Encounter for general adult medical examination without abnormal findings: Secondary | ICD-10-CM | POA: Diagnosis not present

## 2021-10-04 DIAGNOSIS — I10 Essential (primary) hypertension: Secondary | ICD-10-CM | POA: Diagnosis not present

## 2021-10-04 LAB — COMPREHENSIVE METABOLIC PANEL
ALT: 12 U/L (ref 0–35)
AST: 16 U/L (ref 0–37)
Albumin: 4.4 g/dL (ref 3.5–5.2)
Alkaline Phosphatase: 55 U/L (ref 39–117)
BUN: 11 mg/dL (ref 6–23)
CO2: 26 mEq/L (ref 19–32)
Calcium: 9.4 mg/dL (ref 8.4–10.5)
Chloride: 102 mEq/L (ref 96–112)
Creatinine, Ser: 0.77 mg/dL (ref 0.40–1.20)
GFR: 96.57 mL/min (ref >60.00)
Glucose, Bld: 91 mg/dL (ref 70–99)
Potassium: 4 mEq/L (ref 3.5–5.1)
Sodium: 135 mEq/L (ref 135–145)
Total Bilirubin: 0.5 mg/dL (ref 0.2–1.2)
Total Protein: 7.3 g/dL (ref 6.0–8.3)

## 2021-10-04 LAB — LIPID PANEL
Cholesterol: 185 mg/dL (ref 0–200)
HDL: 100.6 mg/dL (ref >39.00)
LDL Cholesterol: 73 mg/dL (ref 0–99)
NonHDL: 84.69
Total CHOL/HDL Ratio: 2
Triglycerides: 56 mg/dL (ref 0.0–149.0)
VLDL: 11.2 mg/dL (ref 0.0–40.0)

## 2021-10-04 LAB — CBC WITH DIFFERENTIAL/PLATELET
Basophils Absolute: 0 10*3/uL (ref 0.0–0.1)
Basophils Relative: 0.7 % (ref 0.0–3.0)
Eosinophils Absolute: 0.1 10*3/uL (ref 0.0–0.7)
Eosinophils Relative: 3.6 % (ref 0.0–5.0)
HCT: 35.3 % — ABNORMAL LOW (ref 36.0–46.0)
Hemoglobin: 11.9 g/dL — ABNORMAL LOW (ref 12.0–15.0)
Lymphocytes Relative: 47.7 % — ABNORMAL HIGH (ref 12.0–46.0)
Lymphs Abs: 1.8 10*3/uL (ref 0.7–4.0)
MCHC: 33.6 g/dL (ref 30.0–36.0)
MCV: 85.9 fl (ref 78.0–100.0)
Monocytes Absolute: 0.5 10*3/uL (ref 0.1–1.0)
Monocytes Relative: 12.5 % — ABNORMAL HIGH (ref 3.0–12.0)
Neutro Abs: 1.3 10*3/uL — ABNORMAL LOW (ref 1.4–7.7)
Neutrophils Relative %: 35.5 % — ABNORMAL LOW (ref 43.0–77.0)
Platelets: 219 10*3/uL (ref 150.0–400.0)
RBC: 4.11 Mil/uL (ref 3.87–5.11)
RDW: 14.5 % (ref 11.5–15.5)
WBC: 3.8 10*3/uL — ABNORMAL LOW (ref 4.0–10.5)

## 2021-10-04 LAB — TSH: TSH: 1.55 u[IU]/mL (ref 0.35–5.50)

## 2021-10-04 MED ORDER — LISINOPRIL 2.5 MG PO TABS: 2.5000 mg | ORAL_TABLET | Freq: Every day | ORAL | 3 refills | Status: DC

## 2021-10-04 NOTE — Progress Notes (Signed)
? ?Subjective:  ? ? Patient ID: Elizabeth Carr, female    DOB: June 07, 1981, 41 y.o.   MRN: [id number] ? ?HPI ?Patient presents for yearly preventative medicine examination. She is a pleasant 41 year old female who  has a past medical history of Anemia, Hemochromatosis, Ovarian cyst, and Pregnancy induced hypertension. ? ?Essential Hypertension -currently prescribed lisinopril 2.5 mg daily.  Has been well controlled on this in the past.  She denies dizziness, lightheadedness, chest pain, or shortness of breath. ?BP Readings from Last 3 Encounters:  ?10/04/21 110/80  ?11/02/20 110/80  ?10/02/20 115/80  ? ?Hereditary hemochromatosis-is followed by hematology.  Has not required any recent therapeutic phlebotomy since her iron levels remain below 50 due to blood loss from monthly menstruation.  She denies fatigue, weakness, shortness of breath, chest pain, palpitations, or joint problems ? ?All immunizations and health maintenance protocols were reviewed with the patient and needed orders were placed. ? ?Appropriate screening laboratory values were ordered for the patient including screening of hyperlipidemia, renal function and hepatic function. ? ?Medication reconciliation,  past medical history, social history, problem list and allergies were reviewed in detail with the patient ? ?Goals were established with regard to weight loss, exercise, and  diet in compliance with medications. She stays active and tries to eat healthy  ?Wt Readings from Last 3 Encounters:  ?10/04/21 127 lb (57.6 kg)  ?11/02/20 134 lb (60.8 kg)  ?10/02/20 133 lb 9.6 oz (60.6 kg)  ? ?Has no acute complaints today  ? ?Review of Systems  ?Constitutional: Negative.   ?HENT: Negative.    ?Eyes: Negative.   ?Respiratory: Negative.    ?Cardiovascular: Negative.   ?Gastrointestinal: Negative.   ?Endocrine: Negative.   ?Genitourinary: Negative.   ?Musculoskeletal: Negative.   ?Skin: Negative.   ?Allergic/Immunologic: Negative.   ?Neurological: Negative.    ?Hematological: Negative.   ?Psychiatric/Behavioral: Negative.    ? ?Past Medical History:  ?Diagnosis Date  ? Anemia   ? Hemochromatosis   ? Ovarian cyst   ? Pregnancy induced hypertension   ? with first pregnancy  ? ? ?Social History  ? ?Socioeconomic History  ? Marital status: Married  ?  Spouse name: Not on file  ? Number of children: Not on file  ? Years of education: Not on file  ? Highest education level: Not on file  ?Occupational History  ? Not on file  ?Tobacco Use  ? Smoking status: Never  ? Smokeless tobacco: Never  ?Substance and Sexual Activity  ? Alcohol use: Yes  ?  Comment: rare  ? Drug use: No  ? Sexual activity: Not on file  ?Other Topics Concern  ? Not on file  ?Social History Narrative  ? 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 Married   ? Two children   ? Exercises, likes to go to the beach  ? ?Social Determinants of Health  ? ?Financial Resource Strain: Not on file  ?Food Insecurity: Not on file  ?Transportation Needs: Not on file  ?Physical Activity: Not on file  ?Stress: Not on file  ?Social Connections: Not on file  ?Intimate Partner Violence: Not on file  ? ? ?Past Surgical History:  ?Procedure Laterality Date  ? CERVICAL BIOPSY  W/ LOOP ELECTRODE EXCISION    ? 2010  ? CESAREAN SECTION    ? x 2  ? wisdon teeth extraction    ? ? ?Family History  ?Problem Relation Age of Onset  ? Hypertension Mother   ? Hemochromatosis Mother   ?  Hypertension Father   ? Hypertension Maternal Grandmother   ? Hypothyroidism Maternal Grandmother   ? Pancreatic cancer Maternal Grandfather   ? Hemochromatosis Brother   ? ? ?No Known Allergies ? ?Current Outpatient Medications on File Prior to Visit  ?Medication Sig Dispense Refill  ? loratadine (CLARITIN) 10 MG tablet Take 10 mg by mouth daily as needed.     ? ?No current facility-administered medications on file prior to visit.  ? ? ?BP 110/80   Pulse 60   Temp 98.5 ?F (36.9 ?C) (Oral)   Ht 5\' 2"  (1.575 m)   Wt 127 lb (57.6 kg)   SpO2 99%   BMI 23.23 kg/m?  ? ? ?    ?Objective:  ? Physical Exam ?Vitals and nursing note reviewed.  ?Constitutional:   ?   General: She is not in acute distress. ?   Appearance: Normal appearance. She is well-developed. She is not ill-appearing.  ?HENT:  ?   Head: Normocephalic and atraumatic.  ?   Right Ear: Tympanic membrane, ear canal and external ear normal. There is no impacted cerumen.  ?   Left Ear: Tympanic membrane, ear canal and external ear normal. There is no impacted cerumen.  ?   Nose: Nose normal. No congestion or rhinorrhea.  ?   Mouth/Throat:  ?   Mouth: Mucous membranes are moist.  ?   Pharynx: Oropharynx is clear. No oropharyngeal exudate or posterior oropharyngeal erythema.  ?Eyes:  ?   General:     ?   Right eye: No discharge.     ?   Left eye: No discharge.  ?   Extraocular Movements: Extraocular movements intact.  ?   Conjunctiva/sclera: Conjunctivae normal.  ?   Pupils: Pupils are equal, round, and reactive to light.  ?Neck:  ?   Thyroid: No thyromegaly.  ?   Vascular: No carotid bruit.  ?   Trachea: No tracheal deviation.  ?Cardiovascular:  ?   Rate and Rhythm: Normal rate and regular rhythm.  ?   Pulses: Normal pulses.  ?   Heart sounds: Normal heart sounds. No murmur heard. ?  No friction rub. No gallop.  ?Pulmonary:  ?   Effort: Pulmonary effort is normal. No respiratory distress.  ?   Breath sounds: Normal breath sounds. No stridor. No wheezing, rhonchi or rales.  ?Chest:  ?   Chest wall: No tenderness.  ?Abdominal:  ?   General: Abdomen is flat. Bowel sounds are normal. There is no distension.  ?   Palpations: Abdomen is soft. There is no mass.  ?   Tenderness: There is no abdominal tenderness. There is no right CVA tenderness, left CVA tenderness, guarding or rebound.  ?   Hernia: No hernia is present.  ?Musculoskeletal:     ?   General: No swelling, tenderness, deformity or signs of injury. Normal range of motion.  ?   Cervical back: Normal range of motion and neck supple.  ?   Right lower leg: No edema.  ?   Left  lower leg: No edema.  ?Lymphadenopathy:  ?   Cervical: No cervical adenopathy.  ?Skin: ?   General: Skin is warm and dry.  ?   Coloration: Skin is not jaundiced or pale.  ?   Findings: No bruising, erythema, lesion or rash.  ?Neurological:  ?   General: No focal deficit present.  ?   Mental Status: She is alert and oriented to person, place, and time.  ?  Cranial Nerves: No cranial nerve deficit.  ?   Sensory: No sensory deficit.  ?   Motor: No weakness.  ?   Coordination: Coordination normal.  ?   Gait: Gait normal.  ?   Deep Tendon Reflexes: Reflexes normal.  ?Psychiatric:     ?   Mood and Affect: Mood normal.     ?   Behavior: Behavior normal.     ?   Thought Content: Thought content normal.     ?   Judgment: Judgment normal.  ? ?   ?Assessment & Plan:  ?1. Routine general medical examination at a health care facility ?- Benign exam. Healthy 41 year female  ?- Follow up in one year or sooner if needed ?- CBC with Differential/Platelet; Future ?- Comprehensive metabolic panel; Future ?- Lipid panel; Future ?- TSH; Future ? ?2. Essential hypertension ?- Well controlled.  ?- CBC with Differential/Platelet; Future ?- Comprehensive metabolic panel; Future ?- Lipid panel; Future ?- TSH; Future ?- lisinopril (ZESTRIL) 2.5 MG tablet; Take 1 tablet (2.5 mg total) by mouth daily.  Dispense: 90 tablet; Refill: 3 ? ?3. Other hemochromatosis ?- Follow up with hematology as directed  ?- CBC with Differential/Platelet; Future ?- Comprehensive metabolic panel; Future ?- Lipid panel; Future ?- TSH; Future ? ?Shirline Frees, NP ? ?

## 2021-11-01 ENCOUNTER — Inpatient Hospital Stay: Payer: 59 | Attending: Hematology & Oncology

## 2021-11-01 ENCOUNTER — Telehealth: Payer: Self-pay | Admitting: *Deleted

## 2021-11-01 ENCOUNTER — Encounter: Payer: Self-pay | Admitting: Hematology & Oncology

## 2021-11-01 ENCOUNTER — Inpatient Hospital Stay: Payer: 59 | Admitting: Hematology & Oncology

## 2021-11-01 DIAGNOSIS — Z79899 Other long term (current) drug therapy: Secondary | ICD-10-CM | POA: Insufficient documentation

## 2021-11-01 LAB — CBC WITH DIFFERENTIAL (CANCER CENTER ONLY)
Abs Immature Granulocytes: 0 10*3/uL (ref 0.00–0.07)
Basophils Absolute: 0 10*3/uL (ref 0.0–0.1)
Basophils Relative: 1 %
Eosinophils Absolute: 0.2 10*3/uL (ref 0.0–0.5)
Eosinophils Relative: 4 %
HCT: 34.1 % — ABNORMAL LOW (ref 36.0–46.0)
Hemoglobin: 11.2 g/dL — ABNORMAL LOW (ref 12.0–15.0)
Immature Granulocytes: 0 %
Lymphocytes Relative: 51 %
Lymphs Abs: 1.7 10*3/uL (ref 0.7–4.0)
MCH: 28.6 pg (ref 26.0–34.0)
MCHC: 32.8 g/dL (ref 30.0–36.0)
MCV: 87.2 fL (ref 80.0–100.0)
Monocytes Absolute: 0.4 10*3/uL (ref 0.1–1.0)
Monocytes Relative: 12 %
Neutro Abs: 1.1 10*3/uL — ABNORMAL LOW (ref 1.7–7.7)
Neutrophils Relative %: 32 %
Platelet Count: 239 10*3/uL (ref 150–400)
RBC: 3.91 MIL/uL (ref 3.87–5.11)
RDW: 13.8 % (ref 11.5–15.5)
WBC Count: 3.4 10*3/uL — ABNORMAL LOW (ref 4.0–10.5)
nRBC: 0 % (ref 0.0–0.2)

## 2021-11-01 LAB — CMP (CANCER CENTER ONLY)
ALT: 10 U/L (ref 0–44)
AST: 13 U/L — ABNORMAL LOW (ref 15–41)
Albumin: 4.4 g/dL (ref 3.5–5.0)
Alkaline Phosphatase: 61 U/L (ref 38–126)
Anion gap: 6 (ref 5–15)
BUN: 10 mg/dL (ref 6–20)
CO2: 28 mmol/L (ref 22–32)
Calcium: 9.5 mg/dL (ref 8.9–10.3)
Chloride: 105 mmol/L (ref 98–111)
Creatinine: 0.81 mg/dL (ref 0.44–1.00)
GFR, Estimated: >60 mL/min (ref >60)
Glucose, Bld: 87 mg/dL (ref 70–99)
Potassium: 4.2 mmol/L (ref 3.5–5.1)
Sodium: 139 mmol/L (ref 135–145)
Total Bilirubin: 0.4 mg/dL (ref 0.3–1.2)
Total Protein: 6.8 g/dL (ref 6.5–8.1)

## 2021-11-01 LAB — FERRITIN: Ferritin: <4 ng/mL — ABNORMAL LOW (ref 11–307)

## 2021-11-01 LAB — IRON AND IRON BINDING CAPACITY (CC-WL,HP ONLY)
Iron: 36 ug/dL (ref 28–170)
Saturation Ratios: 11 % (ref 10.4–31.8)
TIBC: 322 ug/dL (ref 250–450)
UIBC: 286 ug/dL (ref 148–442)

## 2021-11-01 NOTE — Telephone Encounter (Signed)
-----   Message from Josph Macho, MD sent at 11/01/2021 12:52 PM EDT ----- ?Call - the iron is still low!! The saturation is 11%..  You do not need a phlebotomy!!  Pete ?

## 2021-11-01 NOTE — Telephone Encounter (Signed)
As noted below by Dr. Myna Hidalgo, I informed the patient that the iron saturation is 11%. You do NOT need a phlebotomy at this time. Please call the office if you have any questions or concerns.  ?

## 2021-11-01 NOTE — Progress Notes (Signed)
?Hematology and Oncology Follow Up Visit ? ?Elizabeth Carr ?[id number] ?Nov 18, 1980 41 y.o. ?11/01/2021 ? ? 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 ?Current Therapy:   ?Phlebotomy for ferritin less than 100 or iron saturation less than 30% ?    ?Interim History:  [name] is back for follow-up.  We see her yearly.  As always, it is somewhat fun talking to her.  She is getting ready to go up to [place] with I think her son for a school trip.  I am sure that she will have a wonderful time.  This weekend, her daughter is having a softball tournament. ? ?She is still working.  She still is quite busy at work. ? ?Iron overload and clearly is not a problem for her.  She still has her monthly cycles.  We last saw her a year ago, her ferritin was 4 and iron saturation was 9%. ? ?She has had no problems with COVID.Marland Kitchen  She has had no issues with bowels or bladder.  She has had no rashes.  There is been no joint aches or pains.  She is exercising. ? ?Overall, I would say performance status is ECOG 0.   ?Medications:  ?Current Outpatient Medications:  ?  lisinopril (ZESTRIL) 2.5 MG tablet, Take 1 tablet (2.5 mg total) by mouth daily., Disp: 90 tablet, Rfl: 3 ?  loratadine (CLARITIN) 10 MG tablet, Take 10 mg by mouth daily as needed. , Disp: , Rfl:  ? ?Allergies: No Known Allergies ? ?Past Medical History, Surgical history, Social history, and Family History were reviewed and updated. ? ?Review of Systems: ?Review of Systems  ?Constitutional: Negative.   ?HENT:  Negative.    ?Eyes: Negative.   ?Respiratory: Negative.    ?Cardiovascular: Negative.   ?Gastrointestinal: Negative.   ?Endocrine: Negative.   ?Genitourinary: Negative.    ?Musculoskeletal: Negative.   ?Skin: Negative.   ?Neurological: Negative.   ?Hematological: Negative.   ?Psychiatric/Behavioral: Negative.    ? ?Physical Exam: ? height is 5\' 2"  (1.575 m) and weight is 129 lb (58.5 kg). Her oral temperature is 98.1  ?F (36.7 ?C). Her blood pressure is 120/68 and her pulse is 50 (abnormal). Her respiration is 16 and oxygen saturation is 100%.  ? ?Wt Readings from Last 3 Encounters:  ?11/01/21 129 lb (58.5 kg)  ?10/04/21 127 lb (57.6 kg)  ?11/02/20 134 lb (60.8 kg)  ? ? ?Physical Exam ?Vitals reviewed.  ?HENT:  ?   Head: Normocephalic and atraumatic.  ?Eyes:  ?   Pupils: Pupils are equal, round, and reactive to light.  ?Cardiovascular:  ?   Rate and Rhythm: Normal rate and regular rhythm.  ?   Heart sounds: Normal heart sounds.  ?Pulmonary:  ?   Effort: Pulmonary effort is normal.  ?   Breath sounds: Normal breath sounds.  ?Abdominal:  ?   General: Bowel sounds are normal.  ?   Palpations: Abdomen is soft.  ?Musculoskeletal:     ?   General: No tenderness or deformity. Normal range of motion.  ?   Cervical back: Normal range of motion.  ?Lymphadenopathy:  ?   Cervical: No cervical adenopathy.  ?Skin: ?   General: Skin is warm and dry.  ?   Findings: No erythema or rash.  ?Neurological:  ?   Mental Status: She is alert and oriented to person, place, and time.  ?Psychiatric:     ?   Behavior: Behavior normal.     ?  Thought Content: Thought content normal.     ?   Judgment: Judgment normal.  ? ? ?Lab Results  ?Component Value Date  ? WBC 3.4 (L) 11/01/2021  ? HGB 11.2 (L) 11/01/2021  ? HCT 34.1 (L) 11/01/2021  ? MCV 87.2 11/01/2021  ? PLT 239 11/01/2021  ? ?  Chemistry   ?   ?Component Value Date/Time  ? NA 135 10/04/2021 0820  ? K 4.0 10/04/2021 0820  ? CL 102 10/04/2021 0820  ? CO2 26 10/04/2021 0820  ? BUN 11 10/04/2021 0820  ? CREATININE 0.77 10/04/2021 0820  ? CREATININE 0.76 11/02/2020 0753  ?    ?Component Value Date/Time  ? CALCIUM 9.4 10/04/2021 0820  ? ALKPHOS 55 10/04/2021 0820  ? AST 16 10/04/2021 0820  ? AST 21 11/02/2020 0753  ? ALT 12 10/04/2021 0820  ? ALT 19 11/02/2020 0753  ? BILITOT 0.5 10/04/2021 0820  ? BILITOT 0.4 11/02/2020 0753  ?  ? ? ? ?Impression and Plan: ?[name] is a very nice 41 year old white  female.  She is homozygous for the major mutation.  However, iron overload has never been a problem for her.  Again, as long as she has her monthly cycles, I just do not believe that iron overload is going to be an issue for her. ? ?We will continue to see her back yearly.  It is somewhat fun talking to her.  I told her to make sure that she stays well-hydrated, and uses a lot of sunscreen since she does have fair skin.   ? ? ?Josph Macho, MD ?3/31/20238:46 AM ?

## 2022-02-03 LAB — HM MAMMOGRAPHY

## 2022-02-07 ENCOUNTER — Encounter: Payer: Self-pay | Admitting: Adult Health

## 2022-10-19 ENCOUNTER — Other Ambulatory Visit: Payer: Self-pay | Admitting: Adult Health

## 2022-10-19 DIAGNOSIS — I10 Essential (primary) hypertension: Secondary | ICD-10-CM

## 2022-10-22 NOTE — Progress Notes (Signed)
Subjective:    Patient ID: Elizabeth Carr, female    DOB: Sep 03, 1980, 42 y.o.   MRN: [id number]  HPI Patient presents for yearly preventative medicine examination. She is a pleasant 42 year old female who  has a past medical history of Anemia, Hemochromatosis, Ovarian cyst, and Pregnancy induced hypertension.  Essential Hypertension -currently prescribed lisinopril 2.5 mg daily.  Has been well controlled on this in the past.  She denies dizziness, lightheadedness, chest pain, or shortness of breath. BP Readings from Last 3 Encounters:  10/23/22 120/80  11/01/21 120/68  10/04/21 110/80   Hereditary hemochromatosis-is followed by hematology.  Has not required any recent therapeutic phlebotomy since her iron levels remain below 50 due to blood loss from monthly menstruation.  She denies fatigue, weakness, shortness of breath, chest pain, palpitations, or joint problems  All immunizations and health maintenance protocols were reviewed with the patient and needed orders were placed. UTD   Appropriate screening laboratory values were ordered for the patient including screening of hyperlipidemia, renal function and hepatic function.   Medication reconciliation,  past medical history, social history, problem list and allergies were reviewed in detail with the patient  Goals were established with regard to weight loss, exercise, and  diet in compliance with medications. She is very active with exercise and eats a healthy diet Wt Readings from Last 3 Encounters:  10/23/22 132 lb (59.9 kg)  11/01/21 129 lb (58.5 kg)  10/04/21 127 lb (57.6 kg)   She is up to date on GYN care and mammograms   Review of Systems  Constitutional: Negative.   HENT: Negative.    Eyes: Negative.   Respiratory: Negative.    Cardiovascular: Negative.   Gastrointestinal: Negative.   Endocrine: Negative.   Genitourinary: Negative.   Musculoskeletal: Negative.   Skin: Negative.   Allergic/Immunologic: Negative.    Neurological: Negative.   Hematological: Negative.   Psychiatric/Behavioral: Negative.     Past Medical History:  Diagnosis Date   Anemia    Hemochromatosis    Ovarian cyst    Pregnancy induced hypertension    with first pregnancy    Social History   Socioeconomic History   Marital status: Married    Spouse name: Not on file   Number of children: Not on file   Years of education: Not on file   Highest education level: Not on file  Occupational History   Not on file  Tobacco Use   Smoking status: Never   Smokeless tobacco: Never  Substance and Sexual Activity   Alcohol use: Yes    Comment: rare   Drug use: No   Sexual activity: Not on file  Other Topics Concern   Not on file  Social History Narrative   RN in Peds - [employer]   Married    Two children    Exercises, likes to go to the [employer]   Social Determinants of Health   Financial Resource Strain: Not on file  Food Insecurity: Not on file  Transportation Needs: Not on file  Physical Activity: Not on file  Stress: Not on file  Social Connections: Not on file  Intimate Partner Violence: Not on file    Past Surgical History:  Procedure Laterality Date   CERVICAL BIOPSY  W/ LOOP ELECTRODE EXCISION     2010   CESAREAN SECTION     x 2   wisdon teeth extraction      Family History  Problem Relation Age of Onset  Hypertension Mother    Hemochromatosis Mother    Hypertension Father    Hemochromatosis Brother    Hypertension Maternal Grandmother    Hypothyroidism Maternal Grandmother    Pancreatic cancer Maternal Grandfather    Stroke Paternal Grandmother     No Known Allergies  Current Outpatient Medications on File Prior to Visit  Medication Sig Dispense Refill   lisinopril (ZESTRIL) 2.5 MG tablet TAKE 1 TABLET BY MOUTH EVERY DAY 90 tablet 0   loratadine (CLARITIN) 10 MG tablet Take 10 mg by mouth daily as needed.      No current facility-administered medications on file prior to visit.     BP 120/80   Pulse (!) 57   Temp 97.9 F (36.6 C) (Oral)   Ht 5\' 2"  (1.575 m)   Wt 132 lb (59.9 kg)   SpO2 99%   BMI 24.14 kg/m       Objective:   Physical Exam Vitals and nursing note reviewed.  Constitutional:      General: She is not in acute distress.    Appearance: Normal appearance. She is not ill-appearing.  HENT:     Head: Normocephalic and atraumatic.     Right Ear: Tympanic membrane, ear canal and external ear normal. There is no impacted cerumen.     Left Ear: Tympanic membrane, ear canal and external ear normal. There is no impacted cerumen.     Nose: Nose normal. No congestion or rhinorrhea.     Mouth/Throat:     Mouth: Mucous membranes are moist.     Pharynx: Oropharynx is clear.  Eyes:     Extraocular Movements: Extraocular movements intact.     Conjunctiva/sclera: Conjunctivae normal.     Pupils: Pupils are equal, round, and reactive to light.  Neck:     Vascular: No carotid bruit.  Cardiovascular:     Rate and Rhythm: Normal rate and regular rhythm.     Pulses: Normal pulses.     Heart sounds: No murmur heard.    No friction rub. No gallop.  Pulmonary:     Effort: Pulmonary effort is normal.     Breath sounds: Normal breath sounds.  Abdominal:     General: Abdomen is flat. Bowel sounds are normal. There is no distension.     Palpations: Abdomen is soft. There is no mass.     Tenderness: There is no abdominal tenderness. There is no guarding or rebound.     Hernia: No hernia is present.  Musculoskeletal:        General: Normal range of motion.     Cervical back: Normal range of motion and neck supple.  Lymphadenopathy:     Cervical: No cervical adenopathy.  Skin:    General: Skin is warm and dry.     Capillary Refill: Capillary refill takes less than 2 seconds.  Neurological:     General: No focal deficit present.     Mental Status: She is alert and oriented to person, place, and time.  Psychiatric:        Mood and Affect: Mood normal.         Behavior: Behavior normal.        Thought Content: Thought content normal.        Judgment: Judgment normal.        Assessment & Plan:  1. Routine general medical examination at a health care facility Very healthy 42 year old female  Labs drawn next week at hematology  - Follow up in  one year or sooner if needed  2. Essential hypertension - well controlled.  - lisinopril (ZESTRIL) 2.5 MG tablet; Take 1 tablet (2.5 mg total) by mouth daily.  Dispense: 90 tablet; Refill: 3  3. Other hemochromatosis - Will see Dr. Myna Hidalgo next week   Shirline Frees, NP

## 2022-10-23 ENCOUNTER — Encounter: Payer: Self-pay | Admitting: Adult Health

## 2022-10-23 ENCOUNTER — Ambulatory Visit (INDEPENDENT_AMBULATORY_CARE_PROVIDER_SITE_OTHER): Payer: 59 | Admitting: Adult Health

## 2022-10-23 VITALS — BP 120/80 | HR 57 | Temp 97.9°F | Ht 62.0 in | Wt 132.0 lb

## 2022-10-23 DIAGNOSIS — Z0000 Encounter for general adult medical examination without abnormal findings: Secondary | ICD-10-CM

## 2022-10-23 DIAGNOSIS — I10 Essential (primary) hypertension: Secondary | ICD-10-CM | POA: Diagnosis not present

## 2022-10-23 MED ORDER — LISINOPRIL 2.5 MG PO TABS: 2.5000 mg | ORAL_TABLET | Freq: Every day | ORAL | 3 refills | Status: DC

## 2022-10-23 NOTE — Patient Instructions (Addendum)
It was great seeing you today   I will see what your labs show that are done by Dr. Marin Olp   Please let me know if you need anything

## 2022-10-31 ENCOUNTER — Inpatient Hospital Stay: Payer: 59 | Attending: Hematology & Oncology

## 2022-10-31 ENCOUNTER — Encounter: Payer: Self-pay | Admitting: *Deleted

## 2022-10-31 ENCOUNTER — Encounter: Payer: Self-pay | Admitting: Hematology & Oncology

## 2022-10-31 ENCOUNTER — Other Ambulatory Visit: Payer: Self-pay

## 2022-10-31 ENCOUNTER — Inpatient Hospital Stay: Payer: 59 | Admitting: Hematology & Oncology

## 2022-10-31 DIAGNOSIS — Z79899 Other long term (current) drug therapy: Secondary | ICD-10-CM | POA: Diagnosis not present

## 2022-10-31 LAB — CBC WITH DIFFERENTIAL (CANCER CENTER ONLY)
Abs Immature Granulocytes: 0.01 10*3/uL (ref 0.00–0.07)
Basophils Absolute: 0 10*3/uL (ref 0.0–0.1)
Basophils Relative: 1 %
Eosinophils Absolute: 0.2 10*3/uL (ref 0.0–0.5)
Eosinophils Relative: 5 %
HCT: 32.6 % — ABNORMAL LOW (ref 36.0–46.0)
Hemoglobin: 10.4 g/dL — ABNORMAL LOW (ref 12.0–15.0)
Immature Granulocytes: 0 %
Lymphocytes Relative: 51 %
Lymphs Abs: 1.7 10*3/uL (ref 0.7–4.0)
MCH: 26.7 pg (ref 26.0–34.0)
MCHC: 31.9 g/dL (ref 30.0–36.0)
MCV: 83.6 fL (ref 80.0–100.0)
Monocytes Absolute: 0.3 10*3/uL (ref 0.1–1.0)
Monocytes Relative: 9 %
Neutro Abs: 1.1 10*3/uL — ABNORMAL LOW (ref 1.7–7.7)
Neutrophils Relative %: 34 %
Platelet Count: 259 10*3/uL (ref 150–400)
RBC: 3.9 MIL/uL (ref 3.87–5.11)
RDW: 15.4 % (ref 11.5–15.5)
WBC Count: 3.3 10*3/uL — ABNORMAL LOW (ref 4.0–10.5)
nRBC: 0 % (ref 0.0–0.2)

## 2022-10-31 LAB — CMP (CANCER CENTER ONLY)
ALT: 9 U/L (ref 0–44)
AST: 15 U/L (ref 15–41)
Albumin: 4.4 g/dL (ref 3.5–5.0)
Alkaline Phosphatase: 57 U/L (ref 38–126)
Anion gap: 9 (ref 5–15)
BUN: 11 mg/dL (ref 6–20)
CO2: 27 mmol/L (ref 22–32)
Calcium: 9.1 mg/dL (ref 8.9–10.3)
Chloride: 105 mmol/L (ref 98–111)
Creatinine: 0.81 mg/dL (ref 0.44–1.00)
GFR, Estimated: >60 mL/min (ref >60)
Glucose, Bld: 101 mg/dL — ABNORMAL HIGH (ref 70–99)
Potassium: 4 mmol/L (ref 3.5–5.1)
Sodium: 141 mmol/L (ref 135–145)
Total Bilirubin: 0.4 mg/dL (ref 0.3–1.2)
Total Protein: 6.9 g/dL (ref 6.5–8.1)

## 2022-10-31 LAB — IRON AND IRON BINDING CAPACITY (CC-WL,HP ONLY)
Iron: 34 ug/dL (ref 28–170)
Saturation Ratios: 10 % — ABNORMAL LOW (ref 10.4–31.8)
TIBC: 337 ug/dL (ref 250–450)
UIBC: 303 ug/dL (ref 148–442)

## 2022-10-31 LAB — FERRITIN: Ferritin: 3 ng/mL — ABNORMAL LOW (ref 11–307)

## 2022-10-31 NOTE — Progress Notes (Signed)
Hematology and Oncology Follow Up Visit  Elizabeth Carr FJ:[id number] 1981/01/14 42 y.o. 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  She is still working.  She she has to work today.  I thought we were the only office to be working today.  Since we last saw her, she has had no problems.  There is been no problems with fatigue or weakness.  She has had no problems with COVID.  She has had no issues with nausea or vomiting.  There is no cough or shortness of breath.  She has had no rashes.  There is been no bleeding.  She has had no change in monthly cycles.  When we last saw her, her ferritin was less than 4 with an iron saturation of 11%.  Currently, I would have said that her performance status is probably ECOG 0.    Medications:  Current Outpatient Medications:    lisinopril (ZESTRIL) 2.5 MG tablet, Take 1 tablet (2.5 mg total) by mouth daily., Disp: 90 tablet, Rfl: 3   loratadine (CLARITIN) 10 MG tablet, Take 10 mg by mouth daily as needed. , Disp: , Rfl:   Allergies: No Known Allergies  Past Medical History, Surgical history, Social history, and Family History were reviewed and updated.  Review of Systems: Review of Systems  Constitutional: Negative.   HENT:  Negative.    Eyes: Negative.   Respiratory: Negative.    Cardiovascular: Negative.   Gastrointestinal: Negative.   Endocrine: Negative.   Genitourinary: Negative.    Musculoskeletal: Negative.   Skin: Negative.   Neurological: Negative.   Hematological: Negative.   Psychiatric/Behavioral: Negative.      Physical Exam: Vital signs show temperature of 98.3.  Pulse 56.  Blood pressure 126/65.  Weight is 134 pounds.  Wt Readings from Last 3 Encounters:  10/23/22 132 lb (59.9 kg)  11/01/21 129 lb (58.5 kg)   10/04/21 127 lb (57.6 kg)    Physical Exam Vitals reviewed.  HENT:     Head: Normocephalic and atraumatic.  Eyes:     Pupils: Pupils are equal, round, and reactive to light.  Cardiovascular:     Rate and Rhythm: Normal rate and regular rhythm.     Heart sounds: Normal heart sounds.  Pulmonary:     Effort: Pulmonary effort is normal.     Breath sounds: Normal breath sounds.  Abdominal:     General: Bowel sounds are normal.     Palpations: Abdomen is soft.  Musculoskeletal:        General: No tenderness or deformity. Normal range of motion.     Cervical back: Normal range of motion.  Lymphadenopathy:     Cervical: No cervical adenopathy.  Skin:    General: Skin is warm and dry.     Findings: No erythema or rash.  Neurological:     Mental Status: She is alert and oriented to person, place, and time.  Psychiatric:        Behavior: Behavior normal.        Thought Content: Thought content normal.        Judgment: Judgment normal.     Lab Results  Component Value Date   WBC 3.3 (L) 10/31/2022   HGB 10.4 (L) 10/31/2022   HCT 32.6 (L) 10/31/2022   MCV 83.6  10/31/2022   PLT 259 10/31/2022     Chemistry      Component Value Date/Time   NA 139 11/01/2021 0809   K 4.2 11/01/2021 0809   CL 105 11/01/2021 0809   CO2 28 11/01/2021 0809   BUN 10 11/01/2021 0809   CREATININE 0.81 11/01/2021 0809      Component Value Date/Time   CALCIUM 9.5 11/01/2021 0809   ALKPHOS 61 11/01/2021 0809   AST 13 (L) 11/01/2021 0809   ALT 10 11/01/2021 0809   BILITOT 0.4 11/01/2021 0809       Impression and Plan: Elizabeth Carr is a very nice 42 year old white female.  She is homozygous for the major mutation.  However, iron overload has never been a problem for her.  Again, as long as she has her monthly cycles, I just do not believe that iron overload is going to be an issue for her.  I noted that her hemoglobin is a little bit lower.  Her white cell count is also a little bit lower.  The  MCV is also a little bit lower.  Have to believe that there is can be an issue with her iron.  I suspect that she probably is can be actually iron deficient.  I looked at her blood smear.  I waited and did not see anything that looked unusual with respect to her white blood cells.  We are just going to have to follow this.  She is asymptomatic.  Her neutrophils are still adequate.  We will go ahead and still plan to get her back in 1 year.  Volanda Napoleon, MD 3/29/20248:27 AM

## 2022-12-23 ENCOUNTER — Ambulatory Visit: Payer: 59 | Admitting: Adult Health

## 2022-12-23 ENCOUNTER — Encounter: Payer: Self-pay | Admitting: Adult Health

## 2022-12-23 VITALS — BP 110/80 | HR 63 | Temp 97.7°F | Ht 62.0 in | Wt 133.0 lb

## 2022-12-23 DIAGNOSIS — H6992 Unspecified Eustachian tube disorder, left ear: Secondary | ICD-10-CM | POA: Diagnosis not present

## 2022-12-23 DIAGNOSIS — R519 Headache, unspecified: Secondary | ICD-10-CM

## 2022-12-23 LAB — BASIC METABOLIC PANEL
BUN: 8 mg/dL (ref 6–23)
CO2: 26 mEq/L (ref 19–32)
Calcium: 9.3 mg/dL (ref 8.4–10.5)
Chloride: 104 mEq/L (ref 96–112)
Creatinine, Ser: 0.75 mg/dL (ref 0.40–1.20)
GFR: 98.82 mL/min (ref >60.00)
Glucose, Bld: 93 mg/dL (ref 70–99)
Potassium: 4 mEq/L (ref 3.5–5.1)
Sodium: 138 mEq/L (ref 135–145)

## 2022-12-23 LAB — C-REACTIVE PROTEIN: CRP: <1 mg/dL (ref 0.5–20.0)

## 2022-12-23 LAB — SEDIMENTATION RATE: Sed Rate: 7 mm/hr (ref 0–20)

## 2022-12-23 LAB — TSH: TSH: 1.45 u[IU]/mL (ref 0.35–5.50)

## 2022-12-23 MED ORDER — METHYLPREDNISOLONE ACETATE 80 MG/ML IJ SUSP
80.0000 mg | Freq: Once | INTRAMUSCULAR | Status: AC
Start: 2022-12-23 — End: 2022-12-23
  Administered 2022-12-23: 80 mg via INTRAMUSCULAR

## 2022-12-23 MED ORDER — KETOROLAC TROMETHAMINE 60 MG/2ML IM SOLN
60.0000 mg | Freq: Once | INTRAMUSCULAR | Status: AC
Start: 2022-12-23 — End: 2022-12-23
  Administered 2022-12-23: 60 mg via INTRAMUSCULAR

## 2022-12-23 NOTE — Progress Notes (Signed)
Subjective:    Patient ID: Elizabeth Carr, female    DOB: [birth year], 42 y.o.   MRN: [id number]  Headache    42 year old female who  has a past medical history of Anemia, Hemochromatosis, Ovarian cyst, and Pregnancy induced hypertension.  She presents to the office today for an acute issue of headache. She has had a headache  She reports that her symptoms start 4-5 weeks ago when the pollen count was elevated - she started taking allergy medication and her headache improved, a few weeks later she stopped taking her allergy medication and the headache came back but was felt to be a different headache.    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 She was started on a 10 day course of augmentin and this seemed to help to some degree but she continues to have intermittent headaches.   She did not have a headache yesterday but when she woke up this morning she started to have a headache.  Rarely does she have a headache in the afternoon or evening until she lays down in bed.  She does not wake up in the middle of the night from the headache.   No light or sound sensitivity. Exercise does not cause change in headache.   Currently headache feels like a dull ache in the left side.   Tylenol/Motrin seem to help   She id restart Zyrtec over the weekend.   Review of Systems  Neurological:  Positive for headaches.   See HPI   Past Medical History:  Diagnosis Date   Anemia    Hemochromatosis    Ovarian cyst    Pregnancy induced hypertension    with first pregnancy    Social History   Socioeconomic History   Marital status: Married    Spouse name: Not on file   Number of children: Not on file   Years of education: Not on file   Highest education level: Associate degree: academic program  Occupational History   Not on file  Tobacco Use   Smoking status: Never   Smokeless tobacco: Never  Substance and  Sexual Activity   Alcohol use: Yes    Comment: rare   Drug use: No   Sexual activity: Not on file  Other Topics Concern   Not on file  Social History Narrative   RN in Peds - [employer]   Married    Two children    Exercises, likes to go to the [employer]   Social Determinants of Health   Financial Resource Strain: Low Risk  (12/22/2022)   Overall Financial Resource Strain (CARDIA)    Difficulty of Paying Living Expenses: Not hard at all  Food Insecurity: No Food Insecurity (12/22/2022)   Hunger Vital Sign    Worried About Running Out of Food in the Last Year: Never true    Ran Out of Food in the Last Year: Never true  Transportation Needs: No Transportation Needs (12/22/2022)   PRAPARE - Administrator, Civil Service (Medical): No    Lack of Transportation (Non-Medical): No  Physical Activity: Sufficiently Active (12/22/2022)   Exercise Vital Sign    Days of Exercise per Week: 5 days    Minutes of Exercise per Session: 30 min  Stress: No Stress Concern Present (12/22/2022)   [employer] of Occupational Health - Occupational Stress Questionnaire    Feeling of Stress :  Not at all  Social Connections: Socially Integrated (12/22/2022)   Social Connection and Isolation Panel [NHANES]    Frequency of Communication with Friends and Family: More than three times a week    Frequency of Social Gatherings with Friends and Family: More than three times a week    Attends Religious Services: More than 4 times per year    Active Member of [employer] or Organizations: Yes    Attends Engineer, structural: 1 to 4 times per year    Marital Status: Married  Catering manager Violence: Not on file    Past Surgical History:  Procedure Laterality Date   CERVICAL BIOPSY  W/ LOOP ELECTRODE EXCISION     2010   CESAREAN SECTION     x 2   wisdon teeth extraction      Family History  Problem Relation Age of Onset   Hypertension Mother    Hemochromatosis Mother     Hypertension Father    Hemochromatosis Brother    Hypertension Maternal Grandmother    Hypothyroidism Maternal Grandmother    Pancreatic cancer Maternal Grandfather    Stroke Paternal Grandmother     No Known Allergies  Current Outpatient Medications on File Prior to Visit  Medication Sig Dispense Refill   amoxicillin-clavulanate (AUGMENTIN) 875-125 MG tablet Take 1 tablet by mouth 2 (two) times daily.     cetirizine (ZYRTEC ALLERGY) 10 MG tablet      lisinopril (ZESTRIL) 2.5 MG tablet Take 1 tablet (2.5 mg total) by mouth daily. 90 tablet 3   No current facility-administered medications on file prior to visit.    BP 110/80   Pulse 63   Temp 97.7 F (36.5 C) (Oral)   Ht 5\' 2"  (1.575 m)   Wt 133 lb (60.3 kg)   SpO2 100%   BMI 24.33 kg/m       Objective:   Physical Exam Vitals and nursing note reviewed.  Constitutional:      Appearance: Normal appearance.  HENT:     Right Ear: No middle ear effusion. Tympanic membrane is not erythematous.     Left Ear: A middle ear effusion is present. Tympanic membrane is not erythematous.  Cardiovascular:     Rate and Rhythm: Normal rate and regular rhythm.     Pulses: Normal pulses.     Heart sounds: Normal heart sounds.  Pulmonary:     Effort: Pulmonary effort is normal.     Breath sounds: Normal breath sounds.  Musculoskeletal:        General: Normal range of motion.  Skin:    General: Skin is warm and dry.  Neurological:     General: No focal deficit present.     Mental Status: She is alert and oriented to person, place, and time.  Psychiatric:        Mood and Affect: Mood normal.        Behavior: Behavior normal.        Thought Content: Thought content normal.        Judgment: Judgment normal.       Assessment & Plan:  1. Left-sided headache - Possibly from Eustachian tube dysfunction? Will check labs today and given Toradol and Dempo Medrol injection to see if we can abort her headache.  - Consider MRI in the  future.  - Basic Metabolic Panel; Future - TSH; Future - TSH; Future - Sedimentation Rate; Future - C-reactive Protein; Future - ketorolac (TORADOL) injection 60 mg - methylPREDNISolone acetate (DEPO-MEDROL)  injection 80 mg   2. ETD (Eustachian tube dysfunction), left - Will have her use Afrin for the next few days   Shirline Frees, NP  Time spent with patient today was 32 minutes which consisted of chart review, discussing headaches,  work up, treatment answering questions and documentation.

## 2022-12-23 NOTE — Patient Instructions (Signed)
It was great seeing you today   We gave you a shot of Toradol and a shot of Depo Medrol   I am going to check some labs on you   You can try using Afrin

## 2022-12-24 ENCOUNTER — Encounter: Payer: Self-pay | Admitting: Adult Health

## 2022-12-24 NOTE — Telephone Encounter (Signed)
Please advise 

## 2023-05-13 LAB — HM MAMMOGRAPHY

## 2023-05-14 ENCOUNTER — Encounter: Payer: Self-pay | Admitting: Obstetrics and Gynecology

## 2023-10-22 ENCOUNTER — Other Ambulatory Visit: Payer: 59

## 2023-10-22 ENCOUNTER — Other Ambulatory Visit: Payer: Self-pay | Admitting: Adult Health

## 2023-10-22 DIAGNOSIS — I10 Essential (primary) hypertension: Secondary | ICD-10-CM

## 2023-10-22 LAB — CBC WITH DIFFERENTIAL/PLATELET
Basophils Absolute: 0 10*3/uL (ref 0.0–0.1)
Basophils Relative: 0.5 % (ref 0.0–3.0)
Eosinophils Absolute: 0.1 10*3/uL (ref 0.0–0.7)
Eosinophils Relative: 4.8 % (ref 0.0–5.0)
HCT: 32.4 % — ABNORMAL LOW (ref 36.0–46.0)
Hemoglobin: 10.7 g/dL — ABNORMAL LOW (ref 12.0–15.0)
Lymphocytes Relative: 58.4 % — ABNORMAL HIGH (ref 12.0–46.0)
Lymphs Abs: 1.7 10*3/uL (ref 0.7–4.0)
MCHC: 32.9 g/dL (ref 30.0–36.0)
MCV: 78.9 fl (ref 78.0–100.0)
Monocytes Absolute: 0.2 10*3/uL (ref 0.1–1.0)
Monocytes Relative: 8.2 % (ref 3.0–12.0)
Neutro Abs: 0.8 10*3/uL — ABNORMAL LOW (ref 1.4–7.7)
Neutrophils Relative %: 28.1 % — ABNORMAL LOW (ref 43.0–77.0)
Platelets: 250 10*3/uL (ref 150.0–400.0)
RBC: 4.1 Mil/uL (ref 3.87–5.11)
RDW: 16.7 % — ABNORMAL HIGH (ref 11.5–15.5)
WBC: 2.9 10*3/uL — ABNORMAL LOW (ref 4.0–10.5)

## 2023-10-22 LAB — LIPID PANEL
Cholesterol: 158 mg/dL (ref 0–200)
HDL: 77.2 mg/dL (ref >39.00)
LDL Cholesterol: 64 mg/dL (ref 0–99)
NonHDL: 80.37
Total CHOL/HDL Ratio: 2
Triglycerides: 81 mg/dL (ref 0.0–149.0)
VLDL: 16.2 mg/dL (ref 0.0–40.0)

## 2023-10-22 LAB — COMPREHENSIVE METABOLIC PANEL
ALT: 8 U/L (ref 0–35)
AST: 13 U/L (ref 0–37)
Albumin: 4.1 g/dL (ref 3.5–5.2)
Alkaline Phosphatase: 55 U/L (ref 39–117)
BUN: 9 mg/dL (ref 6–23)
CO2: 30 meq/L (ref 19–32)
Calcium: 9.2 mg/dL (ref 8.4–10.5)
Chloride: 104 meq/L (ref 96–112)
Creatinine, Ser: 0.69 mg/dL (ref 0.40–1.20)
GFR: 107.1 mL/min (ref >60.00)
Glucose, Bld: 88 mg/dL (ref 70–99)
Potassium: 4.4 meq/L (ref 3.5–5.1)
Sodium: 139 meq/L (ref 135–145)
Total Bilirubin: 0.2 mg/dL (ref 0.2–1.2)
Total Protein: 7.2 g/dL (ref 6.0–8.3)

## 2023-10-22 LAB — TSH: TSH: 1.16 u[IU]/mL (ref 0.35–5.50)

## 2023-10-25 ENCOUNTER — Other Ambulatory Visit: Payer: Self-pay | Admitting: Adult Health

## 2023-10-25 DIAGNOSIS — I10 Essential (primary) hypertension: Secondary | ICD-10-CM

## 2023-10-27 NOTE — Telephone Encounter (Signed)
 Patient need CPE to schedule for more refills.

## 2023-10-27 NOTE — Telephone Encounter (Signed)
 Pt has an appt.

## 2023-10-29 ENCOUNTER — Encounter: Payer: Self-pay | Admitting: Adult Health

## 2023-10-29 ENCOUNTER — Ambulatory Visit (INDEPENDENT_AMBULATORY_CARE_PROVIDER_SITE_OTHER): Payer: 59 | Admitting: Adult Health

## 2023-10-29 VITALS — BP 108/70 | HR 60 | Temp 98.3°F | Ht 62.0 in | Wt 135.0 lb

## 2023-10-29 DIAGNOSIS — I10 Essential (primary) hypertension: Secondary | ICD-10-CM

## 2023-10-29 DIAGNOSIS — Z0000 Encounter for general adult medical examination without abnormal findings: Secondary | ICD-10-CM

## 2023-10-29 MED ORDER — LISINOPRIL 2.5 MG PO TABS: 2.5000 mg | ORAL_TABLET | Freq: Every day | ORAL | 3 refills | Status: AC

## 2023-10-29 NOTE — Progress Notes (Signed)
 Subjective:    Patient ID: Elizabeth Carr, female    DOB: [birth year], 43 y.o.   MRN: [id number]  HPI Patient presents for yearly preventative medicine examination. She is a pleasant 43 year old female who  has a past medical history of Anemia, Hemochromatosis, Ovarian cyst, and Pregnancy induced hypertension.  Essential Hypertension -currently prescribed lisinopril 2.5 mg daily.  Has been well controlled on this in the past.  She denies dizziness, lightheadedness, chest pain, or shortness of breath. BP Readings from Last 3 Encounters:  10/29/23 108/70  12/23/22 110/80  10/31/22 126/65   Hereditary hemochromatosis-is followed by hematology.  Has not required any recent therapeutic phlebotomy since her iron levels remain below 50 due to blood loss from monthly menstruation.  She denies fatigue, weakness, shortness of breath, chest pain, palpitations, or joint problems   All immunizations and health maintenance protocols were reviewed with the patient and needed orders were placed. She is up to date on routine vaccinations.  Appropriate screening laboratory values were ordered for the patient including screening of hyperlipidemia, renal function and hepatic function.   Medication reconciliation,  past medical history, social history, problem list and allergies were reviewed in detail with the patient  Goals were established with regard to weight loss, exercise, and  diet in compliance with medications.  She stays active and enjoys running. She eats healthy   Wt Readings from Last 3 Encounters:  10/29/23 135 lb (61.2 kg)  12/23/22 133 lb (60.3 kg)  10/31/22 134 lb (60.8 kg)   She is up to date on Gyn care.     Review of Systems  Constitutional: Negative.   HENT: Negative.    Eyes: Negative.   Respiratory: Negative.    Cardiovascular: Negative.   Gastrointestinal: Negative.   Endocrine: Negative.   Genitourinary: Negative.   Musculoskeletal: Negative.   Skin: Negative.    Allergic/Immunologic: Negative.   Neurological: Negative.   Hematological: Negative.   Psychiatric/Behavioral: Negative.     Past Medical History:  Diagnosis Date   Anemia    Hemochromatosis    Ovarian cyst    Pregnancy induced hypertension    with first pregnancy    Social History   Socioeconomic History   Marital status: Married    Spouse name: Not on file   Number of children: Not on file   Years of education: Not on file   Highest education level: Associate degree: academic program  Occupational History   Not on file  Tobacco Use   Smoking status: Never   Smokeless tobacco: Never  Substance and Sexual Activity   Alcohol use: Yes    Comment: rare   Drug use: No   Sexual activity: Not on file  Other Topics Concern   Not on file  Social History Narrative   RN in Peds - [employer]   Married    Two children    Exercises, likes to go to the [employer]   Social Drivers of Health   Financial Resource Strain: Low Risk  (10/28/2023)   Overall Financial Resource Strain (CARDIA)    Difficulty of Paying Living Expenses: Not hard at all  Food Insecurity: No Food Insecurity (10/28/2023)   Hunger Vital Sign    Worried About Running Out of Food in the Last Year: Never true    Ran Out of Food in the Last Year: Never true  Transportation Needs: No Transportation Needs (10/28/2023)   PRAPARE - Administrator, Civil Service (Medical): No  Lack of Transportation (Non-Medical): No  Physical Activity: Sufficiently Active (10/28/2023)   Exercise Vital Sign    Days of Exercise per Week: 5 days    Minutes of Exercise per Session: 30 min  Stress: No Stress Concern Present (10/28/2023)   [employer] of Occupational Health - Occupational Stress Questionnaire    Feeling of Stress : Not at all  Social Connections: Socially Integrated (10/28/2023)   Social Connection and Isolation Panel [NHANES]    Frequency of Communication with Friends and Family: More than three  times a week    Frequency of Social Gatherings with Friends and Family: Three times a week    Attends Religious Services: More than 4 times per year    Active Member of Clubs or Organizations: Yes    Attends Banker Meetings: 1 to 4 times per year    Marital Status: Married  Catering manager Violence: Not on file    Past Surgical History:  Procedure Laterality Date   CERVICAL BIOPSY  W/ LOOP ELECTRODE EXCISION     2010   CESAREAN SECTION     x 2   wisdon teeth extraction      Family History  Problem Relation Age of Onset   Hypertension Mother    Hemochromatosis Mother    Hypertension Father    Hemochromatosis Brother    Hypertension Maternal Grandmother    Hypothyroidism Maternal Grandmother    Pancreatic cancer Maternal Grandfather    Stroke Paternal Grandmother     No Known Allergies  Current Outpatient Medications on File Prior to Visit  Medication Sig Dispense Refill   cetirizine (ZYRTEC ALLERGY) 10 MG tablet      No current facility-administered medications on file prior to visit.    BP 108/70   Pulse 60   Temp 98.3 F (36.8 C) (Oral)   Ht 5\' 2"  (1.575 m)   Wt 135 lb (61.2 kg)   LMP 10/15/2023   SpO2 97%   BMI 24.69 kg/m       Objective:   Physical Exam Vitals and nursing note reviewed.  Constitutional:      General: She is not in acute distress.    Appearance: Normal appearance. She is not ill-appearing.  HENT:     Head: Normocephalic and atraumatic.     Right Ear: Tympanic membrane, ear canal and external ear normal. There is no impacted cerumen.     Left Ear: Tympanic membrane, ear canal and external ear normal. There is no impacted cerumen.     Nose: Nose normal. No congestion or rhinorrhea.     Mouth/Throat:     Mouth: Mucous membranes are moist.     Pharynx: Oropharynx is clear.  Eyes:     Extraocular Movements: Extraocular movements intact.     Conjunctiva/sclera: Conjunctivae normal.     Pupils: Pupils are equal, round,  and reactive to light.  Neck:     Vascular: No carotid bruit.  Cardiovascular:     Rate and Rhythm: Normal rate and regular rhythm.     Pulses: Normal pulses.     Heart sounds: No murmur heard.    No friction rub. No gallop.  Pulmonary:     Effort: Pulmonary effort is normal.     Breath sounds: Normal breath sounds.  Abdominal:     General: Abdomen is flat. Bowel sounds are normal. There is no distension.     Palpations: Abdomen is soft. There is no mass.     Tenderness: There is  no abdominal tenderness. There is no guarding or rebound.     Hernia: No hernia is present.  Musculoskeletal:        General: Normal range of motion.     Cervical back: Normal range of motion and neck supple.  Lymphadenopathy:     Cervical: No cervical adenopathy.  Skin:    General: Skin is warm and dry.     Capillary Refill: Capillary refill takes less than 2 seconds.  Neurological:     General: No focal deficit present.     Mental Status: She is alert and oriented to person, place, and time.  Psychiatric:        Mood and Affect: Mood normal.        Behavior: Behavior normal.        Thought Content: Thought content normal.        Judgment: Judgment normal.       Assessment & Plan:  1. Routine general medical examination at a health care facility (Primary) Today patient counseled on age appropriate routine health concerns for screening and prevention, each reviewed and up to date or declined. Immunizations reviewed and up to date or declined. Labs ordered and reviewed. Risk factors for depression reviewed and negative. Hearing function and visual acuity are intact. ADLs screened and addressed as needed. Functional ability and level of safety reviewed and appropriate. Education, counseling and referrals performed based on assessed risks today. Patient provided with a copy of personalized plan for preventive services. - Reviewed labs she had done prior to todays exam  - Continue to eat healthy and  exercise - Follow up in one year or sooner if needed  2. Essential hypertension - No change. Well controlled.  - lisinopril (ZESTRIL) 2.5 MG tablet; Take 1 tablet (2.5 mg total) by mouth daily.  Dispense: 90 tablet; Refill: 3  3. Other hemochromatosis - She has her follow up with Dr. Twanna Hy tomorrow.   Shirline Frees, NP

## 2023-10-30 ENCOUNTER — Encounter: Payer: Self-pay | Admitting: *Deleted

## 2023-10-30 ENCOUNTER — Inpatient Hospital Stay (HOSPITAL_BASED_OUTPATIENT_CLINIC_OR_DEPARTMENT_OTHER): Payer: 59 | Admitting: Hematology & Oncology

## 2023-10-30 ENCOUNTER — Inpatient Hospital Stay: Payer: 59 | Attending: Hematology & Oncology

## 2023-10-30 ENCOUNTER — Encounter: Payer: Self-pay | Admitting: Hematology & Oncology

## 2023-10-30 DIAGNOSIS — D649 Anemia, unspecified: Secondary | ICD-10-CM | POA: Diagnosis not present

## 2023-10-30 DIAGNOSIS — Z79899 Other long term (current) drug therapy: Secondary | ICD-10-CM | POA: Diagnosis not present

## 2023-10-30 LAB — CBC WITH DIFFERENTIAL (CANCER CENTER ONLY)
Abs Immature Granulocytes: 0.01 10*3/uL (ref 0.00–0.07)
Basophils Absolute: 0 10*3/uL (ref 0.0–0.1)
Basophils Relative: 1 %
Eosinophils Absolute: 0.1 10*3/uL (ref 0.0–0.5)
Eosinophils Relative: 4 %
HCT: 32.9 % — ABNORMAL LOW (ref 36.0–46.0)
Hemoglobin: 10.5 g/dL — ABNORMAL LOW (ref 12.0–15.0)
Immature Granulocytes: 0 %
Lymphocytes Relative: 52 %
Lymphs Abs: 1.9 10*3/uL (ref 0.7–4.0)
MCH: 25.7 pg — ABNORMAL LOW (ref 26.0–34.0)
MCHC: 31.9 g/dL (ref 30.0–36.0)
MCV: 80.4 fL (ref 80.0–100.0)
Monocytes Absolute: 0.4 10*3/uL (ref 0.1–1.0)
Monocytes Relative: 12 %
Neutro Abs: 1.1 10*3/uL — ABNORMAL LOW (ref 1.7–7.7)
Neutrophils Relative %: 31 %
Platelet Count: 293 10*3/uL (ref 150–400)
RBC: 4.09 MIL/uL (ref 3.87–5.11)
RDW: 16 % — ABNORMAL HIGH (ref 11.5–15.5)
WBC Count: 3.5 10*3/uL — ABNORMAL LOW (ref 4.0–10.5)
nRBC: 0 % (ref 0.0–0.2)

## 2023-10-30 LAB — IRON AND IRON BINDING CAPACITY (CC-WL,HP ONLY)
Iron: 40 ug/dL (ref 28–170)
Saturation Ratios: 12 % (ref 10.4–31.8)
TIBC: 339 ug/dL (ref 250–450)
UIBC: 299 ug/dL (ref 148–442)

## 2023-10-30 LAB — CMP (CANCER CENTER ONLY)
ALT: 13 U/L (ref 0–44)
AST: 16 U/L (ref 15–41)
Albumin: 4.4 g/dL (ref 3.5–5.0)
Alkaline Phosphatase: 55 U/L (ref 38–126)
Anion gap: 8 (ref 5–15)
BUN: 8 mg/dL (ref 6–20)
CO2: 26 mmol/L (ref 22–32)
Calcium: 9.2 mg/dL (ref 8.9–10.3)
Chloride: 107 mmol/L (ref 98–111)
Creatinine: 0.82 mg/dL (ref 0.44–1.00)
GFR, Estimated: >60 mL/min (ref >60)
Glucose, Bld: 98 mg/dL (ref 70–99)
Potassium: 4.1 mmol/L (ref 3.5–5.1)
Sodium: 141 mmol/L (ref 135–145)
Total Bilirubin: 0.5 mg/dL (ref 0.0–1.2)
Total Protein: 7 g/dL (ref 6.5–8.1)

## 2023-10-30 LAB — RETICULOCYTES
Immature Retic Fract: 12.4 % (ref 2.3–15.9)
RBC.: 4.09 MIL/uL (ref 3.87–5.11)
Retic Count, Absolute: 36.4 10*3/uL (ref 19.0–186.0)
Retic Ct Pct: 0.9 % (ref 0.4–3.1)

## 2023-10-30 LAB — FERRITIN: Ferritin: 4 ng/mL — ABNORMAL LOW (ref 11–307)

## 2023-10-30 NOTE — Progress Notes (Signed)
 Hematology and Oncology Follow Up Visit  Elizabeth Carr [id number] 24-Mar-1981 43 y.o. 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  We see Elizabeth every year.  Since we last saw Elizabeth, she been doing quite well.  She comes in with Elizabeth Carr.  It is always a lot of fun talking to them.  When we last saw Elizabeth, Elizabeth ferritin was 3 with an iron saturation of 10%.  She does have some mild anemia.  I am sure this is probably from Elizabeth monthly cycles.  She is still working.  She and Elizabeth Carr will be going up to [place] this weekend for a basketball tournament.  She has had no fever.  There is been no problems with COVID or Influenza..  She has had no change in bowel or bladder habits.  She has had no rashes.  She has had no cough or shortness of breath.  Overall, I would say that Elizabeth performance status is probably ECOG 0.      Medications:  Current Outpatient Medications:    cetirizine (ZYRTEC ALLERGY) 10 MG tablet, , Disp: , Rfl:    lisinopril (ZESTRIL) 2.5 MG tablet, Take 1 tablet (2.5 mg total) by mouth daily., Disp: 90 tablet, Rfl: 3  Allergies: No Known Allergies  Past Medical History, Surgical history, Social history, and Family History were reviewed and updated.  Review of Systems: Review of Systems  Constitutional: Negative.   HENT:  Negative.    Eyes: Negative.   Respiratory: Negative.    Cardiovascular: Negative.   Gastrointestinal: Negative.   Endocrine: Negative.   Genitourinary: Negative.    Musculoskeletal: Negative.   Skin: Negative.   Neurological: Negative.   Hematological: Negative.   Psychiatric/Behavioral: Negative.      Physical Exam: Vital signs show temperature of 98.3.  Pulse 56.  Blood pressure 109/69.  Weight is 134 pounds.  Wt Readings from  Last 3 Encounters:  10/30/23 134 lb (60.8 kg)  10/29/23 135 lb (61.2 kg)  12/23/22 133 lb (60.3 kg)    Physical Exam Vitals reviewed.  HENT:     Head: Normocephalic and atraumatic.  Eyes:     Pupils: Pupils are equal, round, and reactive to light.  Cardiovascular:     Rate and Rhythm: Normal rate and regular rhythm.     Heart sounds: Normal heart sounds.  Pulmonary:     Effort: Pulmonary effort is normal.     Breath sounds: Normal breath sounds.  Abdominal:     General: Bowel sounds are normal.     Palpations: Abdomen is soft.  Musculoskeletal:        General: No tenderness or deformity. Normal range of motion.     Cervical back: Normal range of motion.  Lymphadenopathy:     Cervical: No cervical adenopathy.  Skin:    General: Skin is warm and dry.     Findings: No erythema or rash.  Neurological:     Mental Status: She is alert and oriented to person, place, and time.  Psychiatric:        Behavior: Behavior normal.        Thought Content: Thought content normal.        Judgment: Judgment normal.     Lab Results  Component Value Date   WBC 3.5 (  L) 10/30/2023   HGB 10.5 (L) 10/30/2023   HCT 32.9 (L) 10/30/2023   MCV 80.4 10/30/2023   PLT 293 10/30/2023     Chemistry      Component Value Date/Time   NA 141 10/30/2023 0803   K 4.1 10/30/2023 0803   CL 107 10/30/2023 0803   CO2 26 10/30/2023 0803   BUN 8 10/30/2023 0803   CREATININE 0.82 10/30/2023 0803      Component Value Date/Time   CALCIUM 9.2 10/30/2023 0803   ALKPHOS 55 10/30/2023 0803   AST 16 10/30/2023 0803   ALT 13 10/30/2023 0803   BILITOT 0.5 10/30/2023 0803       Impression and Plan: Elizabeth Carr is a very nice 43 year old white female.  She is homozygous for the major mutation.  However, iron overload has never been a problem for Elizabeth.  Again, as long as she has Elizabeth monthly cycles, I just do not believe that iron overload is going to be an issue for Elizabeth.  Today, Elizabeth white cell count is a  little bit better.  I am happy about that.  She has a ANC of 1.1.  This is still some that we have to watch..  The MCV is still on the low side.  Again I suspect that this is probably from low iron.  We will still follow Elizabeth up in 1 year.  I think this would be very reasonable.  She can always come back sooner if there is a problem.  Elizabeth Macho, MD 3/28/20258:43 AM

## 2024-02-09 ENCOUNTER — Ambulatory Visit: Payer: Self-pay

## 2024-02-09 ENCOUNTER — Ambulatory Visit: Admitting: Adult Health

## 2024-02-09 NOTE — Telephone Encounter (Signed)
 Pt has an appt. With another provider tomorrow

## 2024-02-09 NOTE — Telephone Encounter (Signed)
 FYI Only or Action Required?: FYI only for provider.  Patient was last seen in primary care on 10/29/2023 by Merna Huxley, NP.  Called Nurse Triage reporting Back Pain.  Symptoms began several days ago.  Interventions attempted: OTC medications: Advil and Ice/heat application.  Symptoms are: gradually worsening.Patient says she felt a pop when bending down this morning  Triage Disposition: See PCP When Office is Open (Within 3 Days)  Patient/caregiver understands and will follow disposition?: Yes       Copied from CRM [id number]. Topic: Clinical - Red Word Triage >> Feb 09, 2024  8:50 AM Robinson H wrote: [facility] that prompted transfer to Nurse Triage: Back pain, lower back more on right than left Reason for Disposition  [1] Pain radiates into the thigh or further down the leg AND [2] one leg  Answer Assessment - Initial Assessment Questions 1. ONSET: When did the pain begin?      Started last week on Thursday  2. LOCATION: Where does it hurt? (upper, mid or lower back)     Right iliac crest then moving to the left  3. SEVERITY: How bad is the pain?  (e.g., Scale 1-10; mild, moderate, or severe)   - MILD (1-3): Doesn't interfere with normal activities.    - MODERATE (4-7): Interferes with normal activities or awakens from sleep.    - SEVERE (8-10): Excruciating pain, unable to do any normal activities.      Mild to moderate, took some advil.  4. PATTERN: Is the pain constant? (e.g., yes, no; constant, intermittent)      Constant since Thursday, got a little better over the weekend, but still persistent  5. RADIATION: Does the pain shoot into your legs or somewhere else?     Felt some pain running down right leg  6. CAUSE:  What do you think is causing the back pain?      Unsure of cause, no injury or falls. Pain started out of nowhere  7. BACK OVERUSE:  Any recent lifting of heavy objects, strenuous work or exercise?     No  8. MEDICINES: What have  you taken so far for the pain? (e.g., nothing, acetaminophen, NSAIDS)     Advil  9. NEUROLOGIC SYMPTOMS: Do you have any weakness, numbness, or problems with bowel/bladder control?     No  10. OTHER SYMPTOMS: Do you have any other symptoms? (e.g., fever, abdomen pain, burning with urination, blood in urine)       No  11. PREGNANCY: Is there any chance you are pregnant? When was your last menstrual period?       LMP-started yesterday  Protocols used: Back Pain-A-AH

## 2024-02-10 ENCOUNTER — Ambulatory Visit: Admitting: Family Medicine

## 2024-02-10 ENCOUNTER — Ambulatory Visit

## 2024-02-10 VITALS — BP 124/80 | HR 82 | Resp 12 | Ht 62.0 in | Wt 138.0 lb

## 2024-02-10 DIAGNOSIS — M545 Low back pain, unspecified: Secondary | ICD-10-CM

## 2024-02-10 MED ORDER — METHOCARBAMOL 500 MG PO TABS: 500.0000 mg | ORAL_TABLET | Freq: Three times a day (TID) | ORAL | 0 refills | Status: AC | PRN

## 2024-02-10 MED ORDER — CELECOXIB 100 MG PO CAPS: 100.0000 mg | ORAL_CAPSULE | Freq: Two times a day (BID) | ORAL | 0 refills | Status: AC

## 2024-02-10 MED ORDER — PREDNISONE 20 MG PO TABS
ORAL_TABLET | ORAL | 0 refills | Status: AC
Start: 2024-02-10 — End: ?

## 2024-02-10 NOTE — Patient Instructions (Addendum)
 A few things to remember from today's visit:  Acute bilateral low back pain, unspecified whether sciatica present - Plan: DG Lumbar Spine Complete, celecoxib  (CELEBREX ) 100 MG capsule, methocarbamol  (ROBAXIN ) 500 MG tablet, predniSONE  (DELTASONE ) 20 MG tablet  If pain in right lower extremity is worse or numbness start Prednisone  with breakfast and not at the dsame time with Celebrex . Muscle relaxant can cause drowsiness, so you can take it at bedtime. If not greatly improved in 3-4 weeks, you can arrange a follow up visit.  Do not use My Chart to request refills or for acute issues that need immediate attention. If you send a my chart message, it may take a few days to be addressed, specially if I am not in the office.  Please be sure medication list is accurate. If a new problem present, please set up appointment sooner than planned today.

## 2024-02-10 NOTE — Progress Notes (Signed)
 ACUTE VISIT Chief Complaint  Patient presents with   Back Pain    Ongoing since last Thursday, lower back. No known injury    HPI: Ms.Elizabeth Carr is a 43 y.o. female, a patient of Darleene Shape, NP who was unavailable today. PMHx significant for HTN, Hemochromatosis, & Iron Deficiency Anemia. She is here today complaining of Back Pain x5 days.   As mentioned above, pain began last Thursday w/o any known recent injury.  She says that pain initially started on the right-side (specifically at the iliac crest per pt), diffused to the center of her back where it hurts the most now, and then also moved to the left this morning, occasionally radiating into her anterior proximal thigh w/ associated light tingling.  Describes pain as 5-6/10, constant, mainly sharp but can be dulled by shifting positions, and has been interfering with her sleep.  Back Pain This is a new problem. The current episode started in the past 7 days. The problem is unchanged. The pain is present in the lumbar spine. The pain radiates to the right thigh. The pain is moderate. The symptoms are aggravated by bending, twisting and position. Associated symptoms include tingling. Pertinent negatives include no abdominal pain, bladder incontinence, bowel incontinence, chest pain, dysuria, fever, headaches, numbness, pelvic pain, perianal numbness, weakness or weight loss. She has tried NSAIDs for the symptoms. The treatment provided mild relief.   She says she thought she was improving, but at one point after bending over felt a pop in her back before experiencing a sharp pain and couldn't stand/walk for some time afterwards. Says she has used Advil for pain relief, also tried Biofreeze, which did not help. Of note, she did mention she does do some heavy workouts, but hasn't done that in the past 4 weeks, otherwise there have been no changes in her daily routine. Denies urinary/bowel incontinence, chills/fever, weight loss, or  urinary symptoms.  Review of Systems  Constitutional:  Negative for activity change, appetite change, fatigue, fever and weight loss.  HENT:  Negative for mouth sores, sore throat and trouble swallowing.   Respiratory:  Negative for cough, shortness of breath and wheezing.   Cardiovascular:  Negative for chest pain and leg swelling.  Gastrointestinal:  Negative for abdominal pain, blood in stool, bowel incontinence, nausea and vomiting.  Genitourinary:  Negative for bladder incontinence, decreased urine volume, dysuria, hematuria, pelvic pain, vaginal bleeding and vaginal discharge.  Musculoskeletal:  Positive for back pain. Negative for gait problem.  Skin:  Negative for rash.  Neurological:  Positive for tingling. Negative for weakness, numbness and headaches.       (+) Tingling, Right Leg associated w/ lower back pain  See other pertinent positives and negatives in HPI.  Current Outpatient Medications on File Prior to Visit  Medication Sig Dispense Refill   cetirizine (ZYRTEC ALLERGY) 10 MG tablet      lisinopril  (ZESTRIL ) 2.5 MG tablet Take 1 tablet (2.5 mg total) by mouth daily. 90 tablet 3   No current facility-administered medications on file prior to visit.    Past Medical History:  Diagnosis Date   Anemia    Hemochromatosis    Ovarian cyst    Pregnancy induced hypertension    with first pregnancy   No Known Allergies  Social History   Socioeconomic History   Marital status: Married    Spouse name: Not on file   Number of children: Not on file   Years of education: Not on file  Highest education level: Associate degree: academic program  Occupational History   Not on file  Tobacco Use   Smoking status: Never   Smokeless tobacco: Never  Substance and Sexual Activity   Alcohol use: Yes    Comment: rare   Drug use: No   Sexual activity: Not on file  Other Topics Concern   Not on file  Social History Narrative   RN in Peds - [employer]   Married    Two  children    Exercises, likes to go to the [employer]   Social Drivers of Health   Financial Resource Strain: Low Risk  (02/10/2024)   Overall Financial Resource Strain (CARDIA)    Difficulty of Paying Living Expenses: Not hard at all  Food Insecurity: No Food Insecurity (02/10/2024)   Hunger Vital Sign    Worried About Running Out of Food in the Last Year: Never true    Ran Out of Food in the Last Year: Never true  Transportation Needs: No Transportation Needs (02/10/2024)   PRAPARE - Administrator, Civil Service (Medical): No    Lack of Transportation (Non-Medical): No  Physical Activity: Insufficiently Active (02/10/2024)   Exercise Vital Sign    Days of Exercise per Week: 4 days    Minutes of Exercise per Session: 30 min  Stress: No Stress Concern Present (02/10/2024)   [employer] of Occupational Health - Occupational Stress Questionnaire    Feeling of Stress: Not at all  Social Connections: Socially Integrated (02/10/2024)   Social Connection and Isolation Panel    Frequency of Communication with Friends and Family: More than three times a week    Frequency of Social Gatherings with Friends and Family: Twice a week    Attends Religious Services: More than 4 times per year    Active Member of [employer] or Organizations: Yes    Attends Banker Meetings: 1 to 4 times per year    Marital Status: Married    Vitals:   02/10/24 1126  BP: 124/80  Pulse: 82  Resp: 12  SpO2: 99%   Body mass index is 25.24 kg/m.  Physical Exam Vitals and nursing note reviewed.  Constitutional:      General: She is not in acute distress.    Appearance: She is well-developed. She is not ill-appearing.  HENT:     Head: Normocephalic and atraumatic.  Eyes:     Conjunctiva/sclera: Conjunctivae normal.  Cardiovascular:     Rate and Rhythm: Normal rate and regular rhythm.     Heart sounds: No murmur heard. Pulmonary:     Effort: Pulmonary effort is normal. No respiratory distress.      Breath sounds: Normal breath sounds.  Abdominal:     Palpations: Abdomen is soft. There is no mass.     Tenderness: There is no abdominal tenderness.  Musculoskeletal:     Lumbar back: Spasms and bony tenderness (mild, on palpation) present. No tenderness. Negative right straight leg raise test and negative left straight leg raise test.     Right lower leg: No edema.     Left lower leg: No edema.     Comments: Mild pain elicited with movement on exam table during examination.  Skin:    General: Skin is warm.     Findings: No erythema or rash.  Neurological:     General: No focal deficit present.     Mental Status: She is alert and oriented to person, place, and time.  Deep Tendon Reflexes:     Reflex Scores:      Patellar reflexes are 2+ on the right side and 2+ on the left side.    Comments: Mildly antalgic gait, not assisted.  Psychiatric:        Mood and Affect: Mood and affect normal.   ASSESSMENT AND PLAN: Ms.Elizabeth Carr was seen here today for Lower back pain.   Acute bilateral low back pain, unspecified whether sciatica present We discussed differential diagnosis, most likely benign musculoskeletal pain. Because she mentions an episode of severe pain with a pop, we decided to have lumbar x-ray done today. Monitor for new symptoms. Stop OTC NSAIDs. She agrees with trying Celebrex  100 mg twice daily for 7 to 10 days and methocarbamol  500 mg at bedtime. If right thigh pain or tingling get worse, she was instructed to fill prednisone  and take it with breakfast, avoid taking it with NSAIDs. Side effects of medications discussed. Instructed about warning signs. Lumbar X ray with no fracture and mild DDD, osteophytes.  -     DG Lumbar Spine Complete; Future -     Celecoxib ; Take 1 capsule (100 mg total) by mouth 2 (two) times daily for 10 days.  Dispense: 20 capsule; Refill: 0 -     Methocarbamol ; Take 1 tablet (500 mg total) by mouth every 8 (eight) hours as  needed for up to 15 days for muscle spasms.  Dispense: 45 tablet; Refill: 0 -     predniSONE ; 3 tabs for 2 days, 2 tabs for 3 days, 1 tabs for 3 days, and 1/2 tab for 3 days. Take tables together with breakfast.  Dispense: 17 tablet; Refill: 0  I spent a total of 31 minutes in both face to face and non face to face activities for this visit on the date of this encounter. During this time history was obtained and documented, examination was performed, prior imaging reviewed, and assessment/plan discussed.  Return if symptoms worsen or fail to improve.  I,Emily Lagle,acting as a Neurosurgeon for Carrye Goller [place], MD.,have documented all relevant documentation on the behalf of Auden Wettstein [place], MD,as directed by  Turki Tapanes [place], MD while in the presence of Rayelynn Loyal [place], MD.  I, Naydeen Speirs [place], MD, have reviewed all documentation for this visit. The documentation on 02/10/24 for the exam, diagnosis, procedures, and orders are all accurate and complete. Dalessandro Baldyga G. [place], MD  [facility]. [place] office.

## 2024-02-13 ENCOUNTER — Ambulatory Visit: Payer: Self-pay | Admitting: Family Medicine

## 2024-07-07 LAB — HM MAMMOGRAPHY

## 2024-10-28 ENCOUNTER — Inpatient Hospital Stay

## 2024-10-28 ENCOUNTER — Ambulatory Visit: Admitting: Hematology & Oncology
